# Patient Record
Sex: Female | Born: 1975 | Race: White | Hispanic: No | Marital: Married | Smoking: Never smoker
Health system: Southern US, Community
[De-identification: ages and names within clinical notes are randomized; demographics above are authoritative.]

## PROBLEM LIST (undated history)

## (undated) HISTORY — PX: DENTAL SURGERY: SHX609

---

## 2005-09-03 ENCOUNTER — Inpatient Hospital Stay: Payer: Self-pay | Admitting: Obstetrics and Gynecology

## 2006-08-21 ENCOUNTER — Observation Stay: Payer: Self-pay | Admitting: Obstetrics and Gynecology

## 2006-12-11 ENCOUNTER — Observation Stay: Payer: Self-pay | Admitting: Obstetrics and Gynecology

## 2006-12-23 ENCOUNTER — Inpatient Hospital Stay: Payer: Self-pay

## 2013-01-20 ENCOUNTER — Inpatient Hospital Stay: Payer: Self-pay

## 2013-01-20 LAB — CBC WITH DIFFERENTIAL/PLATELET
Basophil #: 0.1 10*3/uL (ref 0.0–0.1)
Basophil %: 0.5 %
Eosinophil #: 0 10*3/uL (ref 0.0–0.7)
HCT: 29.3 % — ABNORMAL LOW (ref 35.0–47.0)
Lymphocyte #: 2.1 10*3/uL (ref 1.0–3.6)
Lymphocyte %: 16.9 %
MCH: 30.5 pg (ref 26.0–34.0)
MCV: 91 fL (ref 80–100)
Monocyte #: 1.4 x10 3/mm — ABNORMAL HIGH (ref 0.2–0.9)
Neutrophil #: 8.7 10*3/uL — ABNORMAL HIGH (ref 1.4–6.5)
Neutrophil %: 71 %
Platelet: 197 10*3/uL (ref 150–440)
RDW: 15.1 % — ABNORMAL HIGH (ref 11.5–14.5)
WBC: 12.3 10*3/uL — ABNORMAL HIGH (ref 3.6–11.0)

## 2013-01-20 LAB — RAPID HIV-1/2 QL/CONFIRM: HIV-1/2,Rapid Ql: NEGATIVE

## 2014-11-14 NOTE — H&P (Signed)
L&D Evaluation:  History:  HPI 19n y/o [id number] WF [redacted]w[redacted]d   Presents with contractions, pressure   Patient's Medical History No Chronic Illness  H/O preterm and rapid labor   Patient's Surgical History none   Medications Pre Natal Vitamins   Allergies NKDA   Social History none   Family History Non-Contributory   ROS:  ROS All systems were reviewed.  HEENT, CNS, GI, GU, Respiratory, CV, Renal and Musculoskeletal systems were found to be normal.   Exam:  Vital Signs stable   General no apparent distress   Mental Status clear   Abdomen gravid, non-tender   Estimated Fetal Weight Average for gestational age   Back no CVAT   Reflexes 1+   Pelvic no external lesions, 7/70/ballot   Mebranes Intact   FHT normal rate with no decels   Ucx regular   Ucx Frequency 3 min   Skin dry   Other + GBS Well dated   Impression:  Impression active labor, preterm   Plan:  Plan EFM/NST, antibiotics for GBBS prophylaxis   Comments AROM after abx infused for >/= 2 hrs H/O scoliosis -- was old she couldn't have epidural Stadol prn NICU aware   Electronic Signatures: Edison Nasuti (MD)  (Signed 17-Jul-14 14:27)  Authored: L&D Evaluation   Last Updated: 17-Jul-14 14:27 by Edison Nasuti (MD)

## 2015-02-20 ENCOUNTER — Other Ambulatory Visit: Payer: Self-pay | Admitting: Internal Medicine

## 2015-02-20 ENCOUNTER — Ambulatory Visit
Admission: RE | Admit: 2015-02-20 | Discharge: 2015-02-20 | Disposition: A | Discharge status: Home / Self Care | Payer: 59 | Source: Ambulatory Visit | Attending: Internal Medicine | Admitting: Internal Medicine

## 2015-02-20 DIAGNOSIS — K760 Fatty (change of) liver, not elsewhere classified: Secondary | ICD-10-CM | POA: Insufficient documentation

## 2015-02-20 DIAGNOSIS — R1084 Generalized abdominal pain: Secondary | ICD-10-CM | POA: Diagnosis not present

## 2015-02-20 MED ORDER — IOHEXOL 350 MG/ML SOLN
100.0000 mL | Freq: Once | INTRAVENOUS | Status: AC | PRN
  Administered 2015-02-20: 100 mL via INTRAVENOUS

## 2015-02-21 ENCOUNTER — Encounter (HOSPITAL_COMMUNITY): Payer: Self-pay | Admitting: Emergency Medicine

## 2015-02-21 ENCOUNTER — Emergency Department (HOSPITAL_COMMUNITY)
Admission: EM | Admit: 2015-02-21 | Discharge: 2015-02-21 | Disposition: A | Discharge status: Home / Self Care | Payer: 59 | Attending: Emergency Medicine | Admitting: Emergency Medicine

## 2015-02-21 DIAGNOSIS — R109 Unspecified abdominal pain: Secondary | ICD-10-CM | POA: Diagnosis present

## 2015-02-21 DIAGNOSIS — Z79899 Other long term (current) drug therapy: Secondary | ICD-10-CM | POA: Insufficient documentation

## 2015-02-21 DIAGNOSIS — Z3202 Encounter for pregnancy test, result negative: Secondary | ICD-10-CM | POA: Insufficient documentation

## 2015-02-21 DIAGNOSIS — K529 Noninfective gastroenteritis and colitis, unspecified: Secondary | ICD-10-CM | POA: Insufficient documentation

## 2015-02-21 LAB — URINE MICROSCOPIC-ADD ON

## 2015-02-21 LAB — CBC
HEMATOCRIT: 39.5 % (ref 36.0–46.0)
HEMOGLOBIN: 13.3 g/dL (ref 12.0–15.0)
MCH: 29.4 pg (ref 26.0–34.0)
MCHC: 33.7 g/dL (ref 30.0–36.0)
MCV: 87.2 fL (ref 78.0–100.0)
Platelets: 236 10*3/uL (ref 150–400)
RBC: 4.53 MIL/uL (ref 3.87–5.11)
RDW: 13.4 % (ref 11.5–15.5)
WBC: 8 10*3/uL (ref 4.0–10.5)

## 2015-02-21 LAB — COMPREHENSIVE METABOLIC PANEL
ALBUMIN: 3.8 g/dL (ref 3.5–5.0)
ALT: 17 U/L (ref 14–54)
ANION GAP: 12 (ref 5–15)
AST: 34 U/L (ref 15–41)
Alkaline Phosphatase: 51 U/L (ref 38–126)
BILIRUBIN TOTAL: 1 mg/dL (ref 0.3–1.2)
BUN: 6 mg/dL (ref 6–20)
CO2: 25 mmol/L (ref 22–32)
Calcium: 8.9 mg/dL (ref 8.9–10.3)
Chloride: 97 mmol/L — ABNORMAL LOW (ref 101–111)
Creatinine, Ser: 0.75 mg/dL (ref 0.44–1.00)
GLUCOSE: 107 mg/dL — AB (ref 65–99)
POTASSIUM: 2.9 mmol/L — AB (ref 3.5–5.1)
Sodium: 134 mmol/L — ABNORMAL LOW (ref 135–145)
TOTAL PROTEIN: 7.4 g/dL (ref 6.5–8.1)

## 2015-02-21 LAB — URINALYSIS, ROUTINE W REFLEX MICROSCOPIC
Glucose, UA: NEGATIVE mg/dL
Ketones, ur: >80 mg/dL — AB
LEUKOCYTES UA: NEGATIVE
NITRITE: NEGATIVE
PROTEIN: NEGATIVE mg/dL
SPECIFIC GRAVITY, URINE: 1.029 (ref 1.005–1.030)
UROBILINOGEN UA: 0.2 mg/dL (ref 0.0–1.0)
pH: 6 (ref 5.0–8.0)

## 2015-02-21 LAB — POC URINE PREG, ED: PREG TEST UR: NEGATIVE

## 2015-02-21 LAB — LIPASE, BLOOD: Lipase: 17 U/L — ABNORMAL LOW (ref 22–51)

## 2015-02-21 MED ORDER — ONDANSETRON 8 MG PO TBDP: 8.0000 mg | ORAL_TABLET | Freq: Three times a day (TID) | ORAL | Status: DC | PRN

## 2015-02-21 MED ORDER — LOPERAMIDE HCL 2 MG PO CAPS: 2.0000 mg | ORAL_CAPSULE | Freq: Four times a day (QID) | ORAL | Status: DC | PRN

## 2015-02-21 MED ORDER — TRAMADOL HCL 50 MG PO TABS: 50.0000 mg | ORAL_TABLET | Freq: Four times a day (QID) | ORAL | Status: DC | PRN

## 2015-02-21 MED ORDER — IBUPROFEN 600 MG PO TABS: 600.0000 mg | ORAL_TABLET | Freq: Four times a day (QID) | ORAL | Status: DC | PRN

## 2015-02-21 NOTE — ED Provider Notes (Signed)
CSN: [id number]     Arrival date & time 02/21/15  0043 History   First MD Initiated Contact with Patient 02/21/15 [phone]     Chief Complaint  Patient presents with  . Abdominal Pain     (Consider location/radiation/quality/duration/timing/severity/associated sxs/prior Treatment) HPI Comments: Pt comes in with cc of abd pain. She is a healthy female. Started having some abd discomfort 1 day ago, saw her pcp, and CT was ordered. Her pain started getting worse, so she came to the ER. PT has pain in the lower quadrants, and it is worst in the RLQ. No uti like sx. + n/v/diarrhea. No fevers.   ROS 10 Systems reviewed and are negative for acute change except as noted in the HPI.     The history is provided by the patient.    History reviewed. No pertinent past medical history. History reviewed. No pertinent past surgical history. No family history on file. Social History  Substance Use Topics  . Smoking status: Never Smoker   . Smokeless tobacco: None  . Alcohol Use: No   OB History    No data available     Review of Systems    Allergies  Review of patient's allergies indicates no known allergies.  Home Medications   Prior to Admission medications   Medication Sig Start Date End Date Taking? Authorizing Provider  Cholecalciferol (VITAMIN D3) 5000 UNITS TABS Take 5,000 Units by mouth daily.   Yes Historical Provider, MD  levofloxacin (LEVAQUIN) 500 MG tablet Take 500 mg by mouth daily. 02/20/15  Yes Historical Provider, MD  ibuprofen (ADVIL,MOTRIN) 600 MG tablet Take 1 tablet (600 mg total) by mouth every 6 (six) hours as needed. 02/21/15   Varney Biles, MD  loperamide (IMODIUM) 2 MG capsule Take 1 capsule (2 mg total) by mouth 4 (four) times daily as needed for diarrhea or loose stools. 02/21/15   Varney Biles, MD  ondansetron (ZOFRAN ODT) 8 MG disintegrating tablet Take 1 tablet (8 mg total) by mouth every 8 (eight) hours as needed for nausea. 02/21/15   Varney Biles, MD   traMADol (ULTRAM) 50 MG tablet Take 1 tablet (50 mg total) by mouth every 6 (six) hours as needed. 02/21/15   Devonna Oboyle, MD   BP 122/70 mmHg  Pulse 80  Temp(Src) 99.4 F (37.4 C) (Rectal)  Resp 16  SpO2 96%  LMP 01/28/2015 Physical Exam  Constitutional: She is oriented to person, place, and time. She appears well-developed.  HENT:  Head: Normocephalic and atraumatic.  Eyes: Conjunctivae and EOM are normal. Pupils are equal, round, and reactive to light.  Neck: Normal range of motion. Neck supple.  Cardiovascular: Normal rate, regular rhythm and normal heart sounds.   Pulmonary/Chest: Effort normal and breath sounds normal. No respiratory distress.  Abdominal: Soft. Bowel sounds are normal. She exhibits no distension. There is tenderness. There is no rebound and no guarding.  Bilateral lower quadrant tenderness, right is worst.  Neurological: She is alert and oriented to person, place, and time.  Skin: Skin is warm and dry.  Nursing note and vitals reviewed.   ED Course  Procedures (including critical care time) Labs Review Labs Reviewed  LIPASE, BLOOD - Abnormal; Notable for the following:    Lipase 17 (*)    All other components within normal limits  COMPREHENSIVE METABOLIC PANEL - Abnormal; Notable for the following:    Sodium 134 (*)    Potassium 2.9 (*)    Chloride 97 (*)    Glucose, Bld  107 (*)    All other components within normal limits  URINALYSIS, ROUTINE W REFLEX MICROSCOPIC (NOT AT [facility]) - Abnormal; Notable for the following:    Hgb urine dipstick LARGE (*)    Bilirubin Urine SMALL (*)    Ketones, ur >80 (*)    All other components within normal limits  URINE MICROSCOPIC-ADD ON - Abnormal; Notable for the following:    Squamous Epithelial / LPF FEW (*)    All other components within normal limits  CBC  POC URINE PREG, ED  POC URINE PREG, ED    Imaging Review Ct Abdomen Pelvis W Contrast  02/20/2015   CLINICAL DATA:  Right lower and left lower  quadrant pain for 2 days with diarrhea. Generalized abdominal pain.  EXAM: CT ABDOMEN AND PELVIS WITH CONTRAST  TECHNIQUE: Multidetector CT imaging of the abdomen and pelvis was performed using the standard protocol following bolus administration of intravenous contrast.  CONTRAST:  129mL OMNIPAQUE IOHEXOL 350 MG/ML SOLN  COMPARISON:  None.  FINDINGS: Lower chest: Clear lung bases. Normal heart size without pericardial or pleural effusion.  Hepatobiliary: Mild hepatic steatosis. Normal gallbladder, without biliary ductal dilatation.  Pancreas: Normal, without mass or ductal dilatation.  Spleen: Multiple splenic lesions, on the order of 9 mm or less. These are hypoattenuating and scattered throughout the spleen. The spleen is normal in size, without adjacent adenopathy.  Adrenals/Urinary Tract: Normal adrenal glands. Interpolar right renal lesion of 6 mm is likely a cyst. Normal left kidney, without hydronephrosis. Collapsed urinary bladder.  Stomach/Bowel: Normal stomach, without wall thickening. Normal colon, appendix, and terminal ileum. Suspect mild pelvic small bowel wall thickening with adjacent fluid. Example image 73.  Vascular/Lymphatic: Normal caliber of the aorta and branch vessels. Circumaortic left renal vein. No abdominopelvic adenopathy.  Reproductive: Normal uterus and adnexa.  Other: Small volume cul-de-sac fluid is likely physiologic.  Musculoskeletal: Convex left lumbar spine curvature.  IMPRESSION: 1. Mild pelvic small bowel wall thickening suspected, for which infectious enteritis is a concern. No other explanation for patient's symptoms. 2. Multiple low-density sub cm splenic lesions. Nonspecific. If the patient has left upper quadrant symptoms, or fever, atypical or fungal infection would be a consideration. Presuming no such symptoms, these most likely represent benign entities such as hemangiomas. If further imaging characterization is desired, pre and post contrast abdominal MRI may be  informative (to be performed as an outpatient, when patient can hold of breath.) 3. Mild hepatic steatosis. These results will be called to the ordering clinician or representative by the Radiology Department at the imaging location.   Electronically Signed   By: Abigail Miyamoto M.D.   On: 02/20/2015 19:09   I have personally reviewed and evaluated lab results as part of my medical decision-making.   EKG Interpretation None      MDM   Final diagnoses:  Enteritis   Pt comes in with cc of abd pain, diarrhea. She had a CT done earlier in the day, which i have reviewed. Pt has no fevers, no bloody stools  - will tx as enteritis.    Varney Biles, MD 02/22/15 1041

## 2015-02-21 NOTE — ED Notes (Addendum)
Pt. Left with all belongings and refused wheelchair. Discharge instructions were reviewed and all questions were answered.  

## 2015-02-21 NOTE — Discharge Instructions (Signed)
We saw you in the ER for the abdominal pain, diarrhea. All the results in the ER are normal.  CT scan shows: IMPRESSION: 1. Mild pelvic small bowel wall thickening suspected, for which infectious enteritis is a concern. No other explanation for patient's symptoms. 2. Multiple low-density sub cm splenic lesions. Nonspecific. If the patient has left upper quadrant symptoms, or fever, atypical or fungal infection would be a consideration. Presuming no such symptoms, these most likely represent benign entities such as hemangiomas. If further imaging characterization is desired, pre and post contrast abdominal MRI may be informative (to be performed as an outpatient, when patient can hold of breath.) 3. Mild hepatic steatosis. These results will be called to the ordering clinician or representative by the Radiology Department at the imaging location.   AS DISCUSSED: Most likely cause of enteritis is viral infection or food toxin. Without fevers, bloody stools - antibiotics not indicated. See your pcp if you have bloody stools or the diarrhea lasts longer than 1 week.  Take diarrhea meds only if having severe diarrhea (getting dehydrated) or for diarrhea at night time.  Viral Gastroenteritis Viral gastroenteritis is also known as stomach flu. This condition affects the stomach and intestinal tract. It can cause sudden diarrhea and vomiting. The illness typically lasts 3 to 8 days. Most people develop an immune response that eventually gets rid of the virus. While this natural response develops, the virus can make you quite ill. CAUSES  Many different viruses can cause gastroenteritis, such as rotavirus or noroviruses. You can catch one of these viruses by consuming contaminated food or water. You may also catch a virus by sharing utensils or other personal items with an infected person or by touching a contaminated surface. SYMPTOMS  The most common symptoms are diarrhea and vomiting. These  problems can cause a severe loss of body fluids (dehydration) and a body salt (electrolyte) imbalance. Other symptoms may include:  Fever.  Headache.  Fatigue.  Abdominal pain. DIAGNOSIS  Your caregiver can usually diagnose viral gastroenteritis based on your symptoms and a physical exam. A stool sample may also be taken to test for the presence of viruses or other infections. TREATMENT  This illness typically goes away on its own. Treatments are aimed at rehydration. The most serious cases of viral gastroenteritis involve vomiting so severely that you are not able to keep fluids down. In these cases, fluids must be given through an intravenous line (IV). HOME CARE INSTRUCTIONS   Drink enough fluids to keep your urine clear or pale yellow. Drink small amounts of fluids frequently and increase the amounts as tolerated.  Ask your caregiver for specific rehydration instructions.  Avoid:  Foods high in sugar.  Alcohol.  Carbonated drinks.  Tobacco.  Juice.  Caffeine drinks.  Extremely hot or cold fluids.  Fatty, greasy foods.  Too much intake of anything at one time.  Dairy products until 24 to 48 hours after diarrhea stops.  You may consume probiotics. Probiotics are active cultures of beneficial bacteria. They may lessen the amount and number of diarrheal stools in adults. Probiotics can be found in yogurt with active cultures and in supplements.  Wash your hands well to avoid spreading the virus.  Only take over-the-counter or prescription medicines for pain, discomfort, or fever as directed by your caregiver. Do not give aspirin to children. Antidiarrheal medicines are not recommended.  Ask your caregiver if you should continue to take your regular prescribed and over-the-counter medicines.  Keep all follow-up appointments  as directed by your caregiver. SEEK IMMEDIATE MEDICAL CARE IF:   You are unable to keep fluids down.  You do not urinate at least once every 6  to 8 hours.  You develop shortness of breath.  You notice blood in your stool or vomit. This may look like coffee grounds.  You have abdominal pain that increases or is concentrated in one small area (localized).  You have persistent vomiting or diarrhea.  You have a fever.  The patient is a child younger than 3 months, and he or she has a fever.  The patient is a child older than 3 months, and he or she has a fever and persistent symptoms.  The patient is a child older than 3 months, and he or she has a fever and symptoms suddenly get worse.  The patient is a baby, and he or she has no tears when crying. MAKE SURE YOU:   Understand these instructions.  Will watch your condition.  Will get help right away if you are not doing well or get worse. Document Released: 06/23/2005 Document Revised: 09/15/2011 Document Reviewed: 04/09/2011 [facility] Patient Information 2015 [place], [place]. This information is not intended to replace advice given to you by your health care provider. Make sure you discuss any questions you have with your health care provider.

## 2015-02-21 NOTE — ED Notes (Addendum)
Accidentally clicked off pt urine sample.  RN aware and redoing order.

## 2015-02-21 NOTE — ED Notes (Signed)
Pt. reports pain across her abdomen with emesis and diarrhea onset Sunday , seen by her PCP and CT scan done at [facility] hospital yesterday . Denies fever or chills.

## 2015-06-12 ENCOUNTER — Other Ambulatory Visit: Payer: Self-pay | Admitting: Obstetrics and Gynecology

## 2015-06-12 DIAGNOSIS — Z1231 Encounter for screening mammogram for malignant neoplasm of breast: Secondary | ICD-10-CM

## 2015-06-27 ENCOUNTER — Ambulatory Visit
Admission: RE | Admit: 2015-06-27 | Discharge: 2015-06-27 | Disposition: A | Discharge status: Home / Self Care | Payer: 59 | Source: Ambulatory Visit | Attending: Obstetrics and Gynecology | Admitting: Obstetrics and Gynecology

## 2015-06-27 DIAGNOSIS — Z1231 Encounter for screening mammogram for malignant neoplasm of breast: Secondary | ICD-10-CM | POA: Diagnosis not present

## 2016-05-27 ENCOUNTER — Other Ambulatory Visit: Payer: Self-pay | Admitting: Obstetrics and Gynecology

## 2016-05-27 DIAGNOSIS — Z1231 Encounter for screening mammogram for malignant neoplasm of breast: Secondary | ICD-10-CM

## 2016-07-08 ENCOUNTER — Encounter (HOSPITAL_COMMUNITY): Payer: Self-pay

## 2016-07-08 ENCOUNTER — Ambulatory Visit
Admission: RE | Admit: 2016-07-08 | Discharge: 2016-07-08 | Disposition: A | Discharge status: Home / Self Care | Payer: 59 | Source: Ambulatory Visit | Attending: Obstetrics and Gynecology | Admitting: Obstetrics and Gynecology

## 2016-07-08 DIAGNOSIS — Z1231 Encounter for screening mammogram for malignant neoplasm of breast: Secondary | ICD-10-CM | POA: Diagnosis not present

## 2017-08-30 ENCOUNTER — Other Ambulatory Visit: Payer: Self-pay

## 2017-08-30 ENCOUNTER — Encounter: Payer: Self-pay | Admitting: Emergency Medicine

## 2017-08-30 ENCOUNTER — Ambulatory Visit
Admission: EM | Admit: 2017-08-30 | Discharge: 2017-08-30 | Disposition: A | Discharge status: Home / Self Care | Payer: Managed Care, Other (non HMO) | Attending: Family Medicine | Admitting: Family Medicine

## 2017-08-30 DIAGNOSIS — J020 Streptococcal pharyngitis: Secondary | ICD-10-CM

## 2017-08-30 LAB — RAPID STREP SCREEN (MED CTR MEBANE ONLY): Streptococcus, Group A Screen (Direct): POSITIVE — AB

## 2017-08-30 MED ORDER — AMOXICILLIN 500 MG PO TABS: 500.0000 mg | ORAL_TABLET | Freq: Two times a day (BID) | ORAL | 0 refills | Status: DC

## 2017-08-30 NOTE — ED Triage Notes (Signed)
Patient c/o sore throat since Thursday.

## 2017-08-30 NOTE — ED Provider Notes (Signed)
[facility]    CSN: [id number] Arrival date & time: 08/30/17  0818  History   Chief Complaint Chief Complaint  Patient presents with  . Sore Throat   HPI  42 year old female presents with sore throat.  Patient states that her sore throat started last week and then resolved.  Recurred on Thursday.  She reports associated headache and neck pain as of this morning.  She has no other associated symptoms.  No known exacerbating relieving factors.  She reports recent sick contacts.  No other complaints or concerns at this time.  PMH: Scoliosis  Surgical Hx - No past surgeries.  Home Medications    Prior to Admission medications   Medication Sig Start Date End Date Taking? Authorizing Provider  Cholecalciferol (VITAMIN D3) 5000 UNITS TABS Take 5,000 Units by mouth daily.   Yes [provider]  amoxicillin (AMOXIL) 500 MG tablet Take 1 tablet (500 mg total) by mouth 2 (two) times daily. 08/30/17   Coral Spikes, DO  ibuprofen (ADVIL,MOTRIN) 600 MG tablet Take 1 tablet (600 mg total) by mouth every 6 (six) hours as needed. 02/21/15   Varney Biles, MD   Family History Family History  Problem Relation Age of Onset  . Breast cancer Neg Hx    Social History Social History   Tobacco Use  . Smoking status: Never Smoker  . Smokeless tobacco: Never Used  Substance Use Topics  . Alcohol use: No  . Drug use: No     Allergies   Patient has no known allergies.   Review of Systems Review of Systems  Constitutional: Negative for fever.  HENT: Positive for sore throat.   Musculoskeletal: Positive for neck pain.  Neurological: Positive for headaches.   Physical Exam Triage Vital Signs ED Triage Vitals  Enc Vitals Group     BP 08/30/17 0832 131/81     Pulse Rate 08/30/17 0832 82     Resp 08/30/17 0832 16     Temp 08/30/17 0832 98.7 F (37.1 C)     Temp Source 08/30/17 0832 Oral     SpO2 08/30/17 0832 100 %     Weight 08/30/17 0830 130 lb (59 kg)   Height 08/30/17 0830 5\' 6"  (1.676 m)     Head Circumference --      Peak Flow --      Pain Score 08/30/17 0829 7     Pain Loc --      Pain Edu? --      Excl. in [place]? --    Updated Vital Signs BP 131/81 (BP Location: Left Arm)   Pulse 82   Temp 98.7 F (37.1 C) (Oral)   Resp 16   Ht 5\' 6"  (1.676 m)   Wt 130 lb (59 kg)   LMP 08/16/2017 (Approximate)   SpO2 100%   BMI 20.98 kg/m   Physical Exam  Constitutional: She is oriented to person, place, and time. She appears well-developed and well-nourished. No distress.  HENT:  Head: Normocephalic and atraumatic.  Oropharynx with moderate erythema.  No exudate.  Eyes: Conjunctivae are normal. Right eye exhibits no discharge. Left eye exhibits no discharge.  Cardiovascular: Normal rate and regular rhythm.  Pulmonary/Chest: Effort normal and breath sounds normal.  Neurological: She is alert and oriented to person, place, and time.  Psychiatric: She has a normal mood and affect. Her behavior is normal.  Nursing note and vitals reviewed.  UC Treatments / Results  Labs (all labs ordered are listed, but  only abnormal results are displayed) Labs Reviewed  RAPID STREP SCREEN (NOT AT [facility]) - Abnormal; Notable for the following components:      Result Value   Streptococcus, Group A Screen (Direct) POSITIVE (*)    All other components within normal limits    EKG  EKG Interpretation None       Radiology No results found.  Procedures Procedures (including critical care time)  Medications Ordered in UC Medications - No data to display   Initial Impression / Assessment and Plan / UC Course  I have reviewed the triage vital signs and the nursing notes.  Pertinent labs & imaging results that were available during my care of the patient were reviewed by me and considered in my medical decision making (see chart for details).     42 year old female presents with strep pharyngitis.  Treating with amoxicillin.  Final Clinical  Impressions(s) / UC Diagnoses   Final diagnoses:  Strep pharyngitis    ED Discharge Orders        Ordered    amoxicillin (AMOXIL) 500 MG tablet  2 times daily     08/30/17 0924     Controlled Substance Prescriptions [place] Controlled Substance Registry consulted? Not Applicable   Coral Spikes, [place] 08/30/17 [phone]

## 2017-09-02 ENCOUNTER — Telehealth: Payer: Self-pay | Admitting: Emergency Medicine

## 2017-09-02 NOTE — Telephone Encounter (Signed)
Called to follow up after patient's recent visit. LM to call with any questions or concerns.

## 2017-09-21 ENCOUNTER — Other Ambulatory Visit: Payer: Self-pay | Admitting: Internal Medicine

## 2017-09-21 DIAGNOSIS — Z1231 Encounter for screening mammogram for malignant neoplasm of breast: Secondary | ICD-10-CM

## 2017-10-02 ENCOUNTER — Ambulatory Visit
Admission: RE | Admit: 2017-10-02 | Discharge: 2017-10-02 | Disposition: A | Discharge status: Home / Self Care | Payer: Managed Care, Other (non HMO) | Source: Ambulatory Visit | Attending: Internal Medicine | Admitting: Internal Medicine

## 2017-10-02 DIAGNOSIS — Z1231 Encounter for screening mammogram for malignant neoplasm of breast: Secondary | ICD-10-CM | POA: Diagnosis not present

## 2017-11-29 IMAGING — MG MM DIGITAL SCREENING BILAT W/ CAD
4 series · 4 of 4 positions shown · non-contrast
Comparison: Previous exam(s).

CLINICAL DATA: Screening.

EXAM:
DIGITAL SCREENING BILATERAL MAMMOGRAM WITH CAD

[R CC]
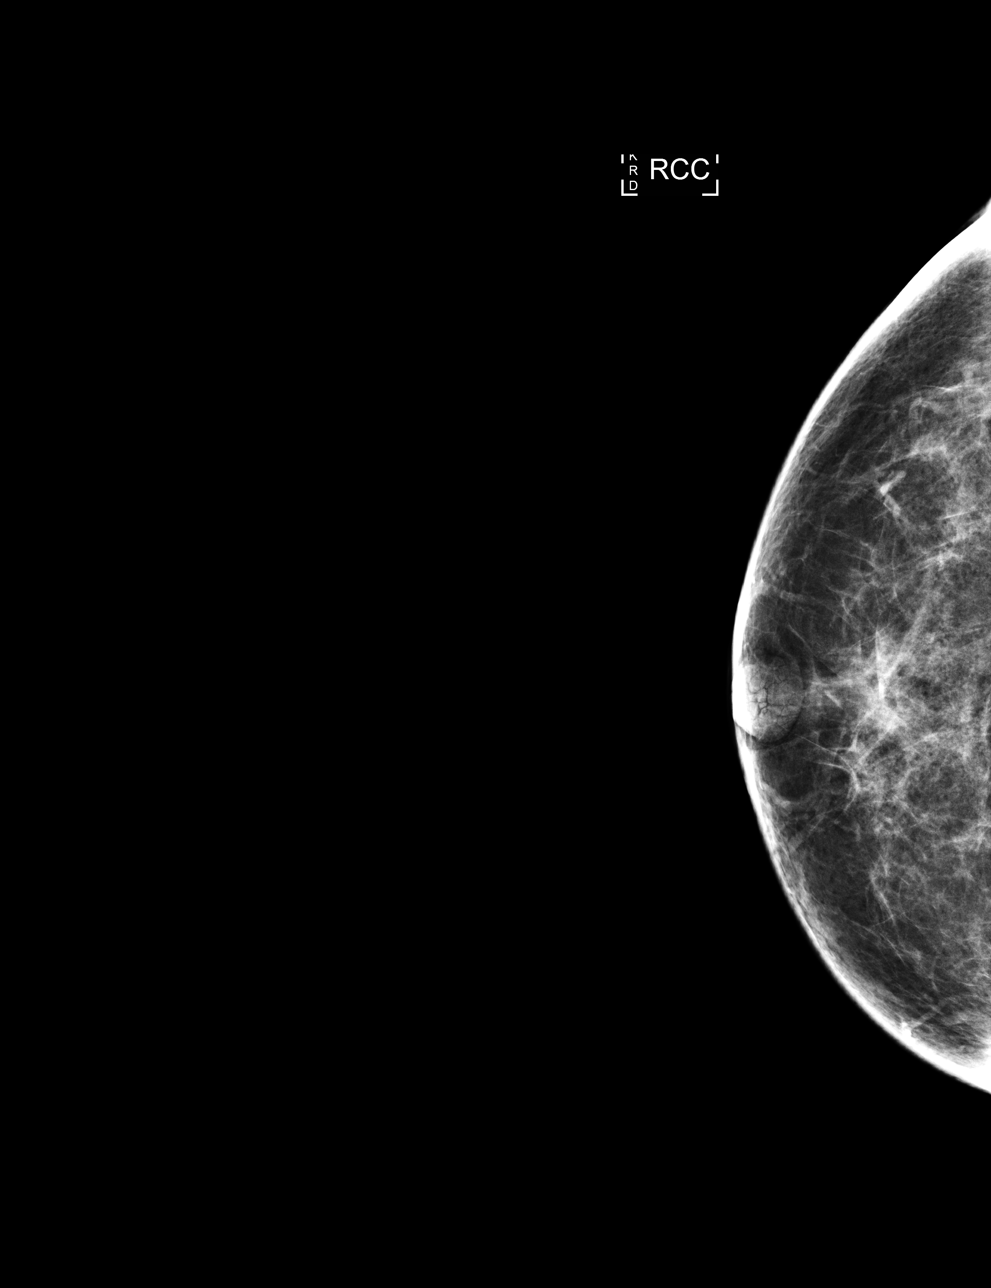

[L MLO]
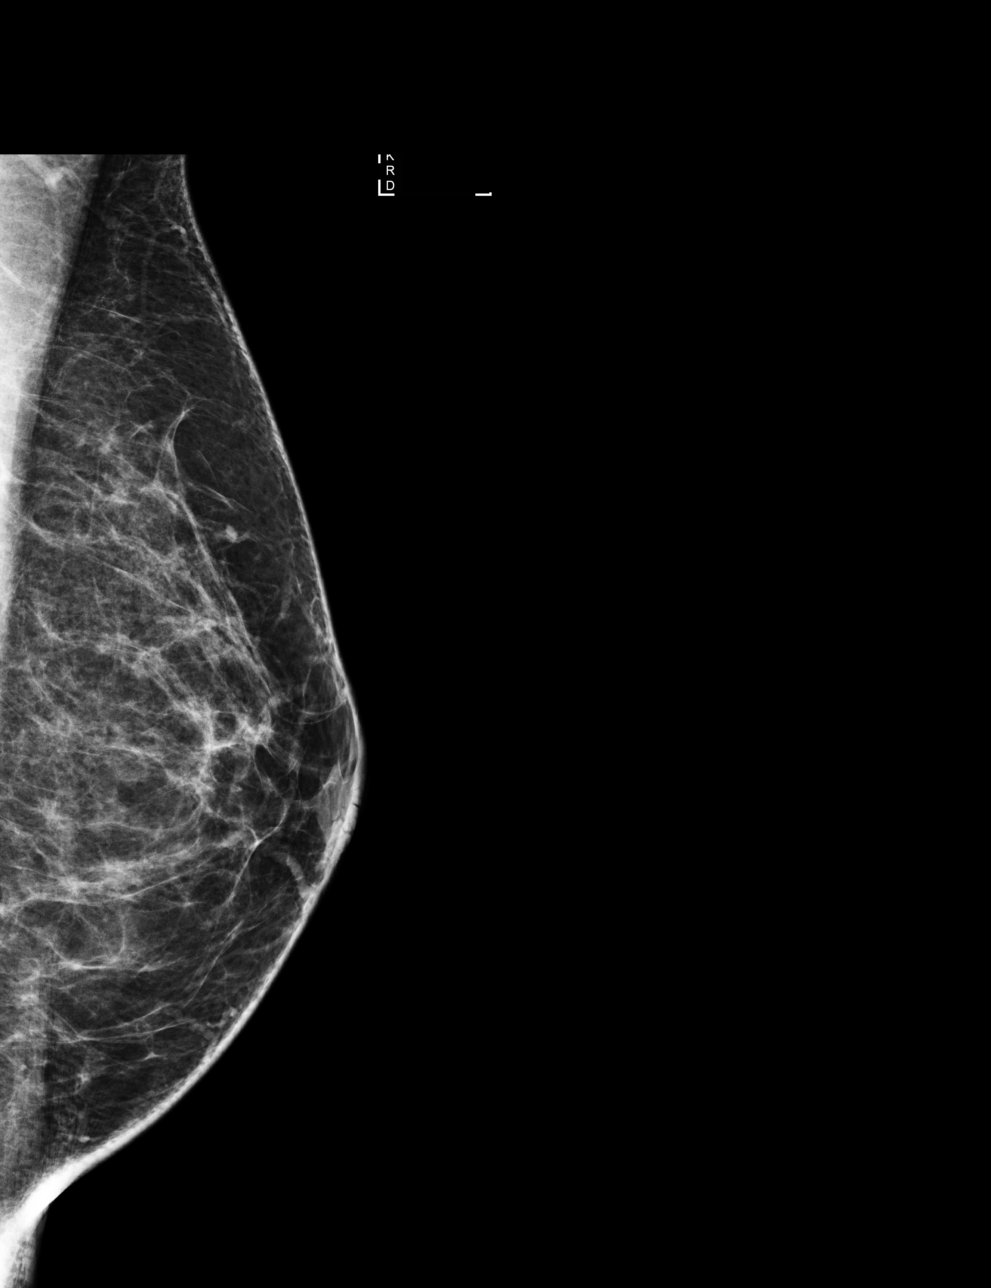

[L CC]
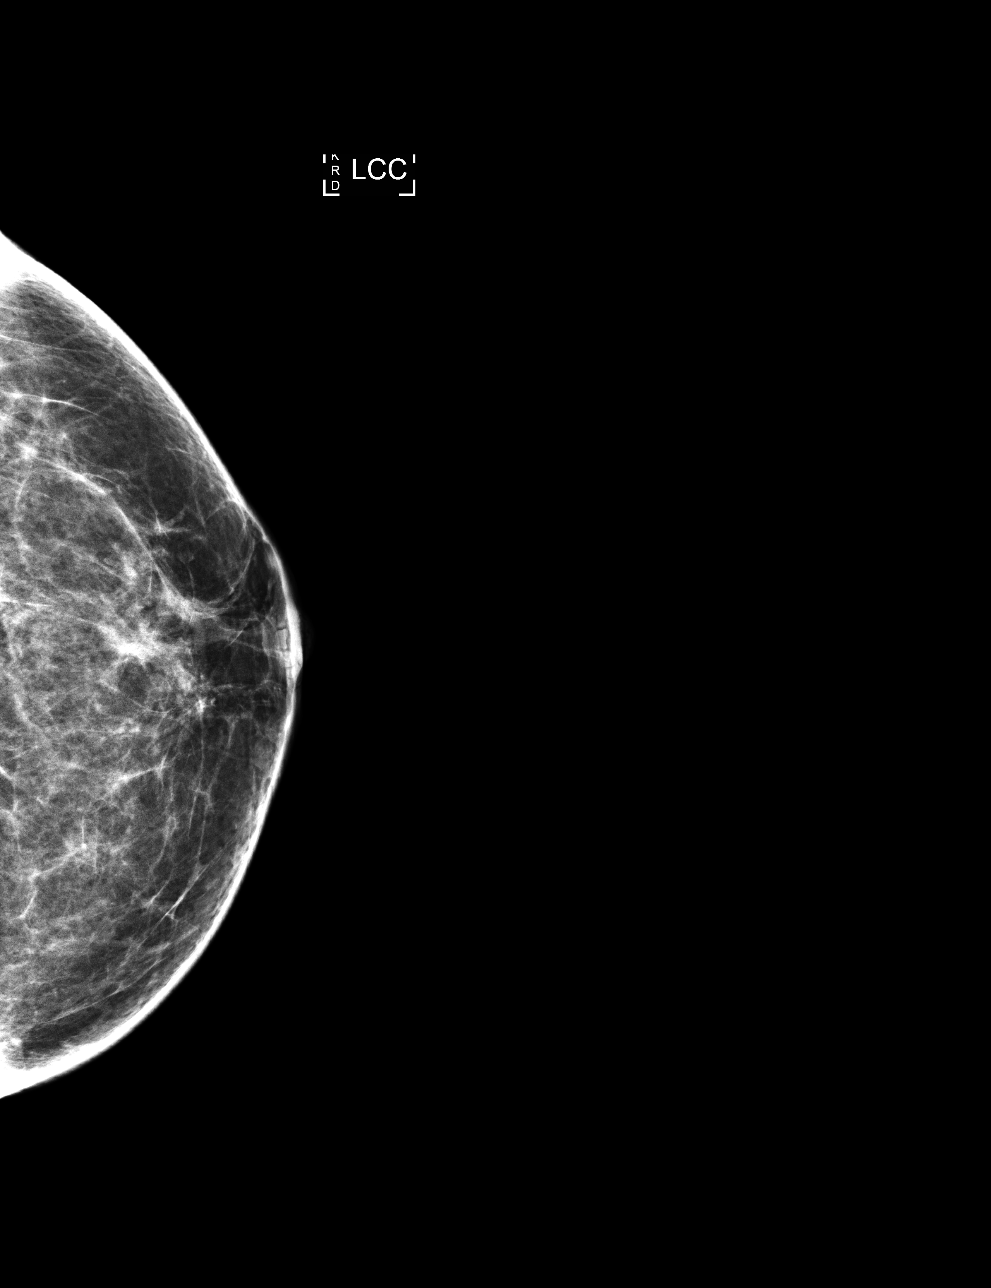

[R MLO]
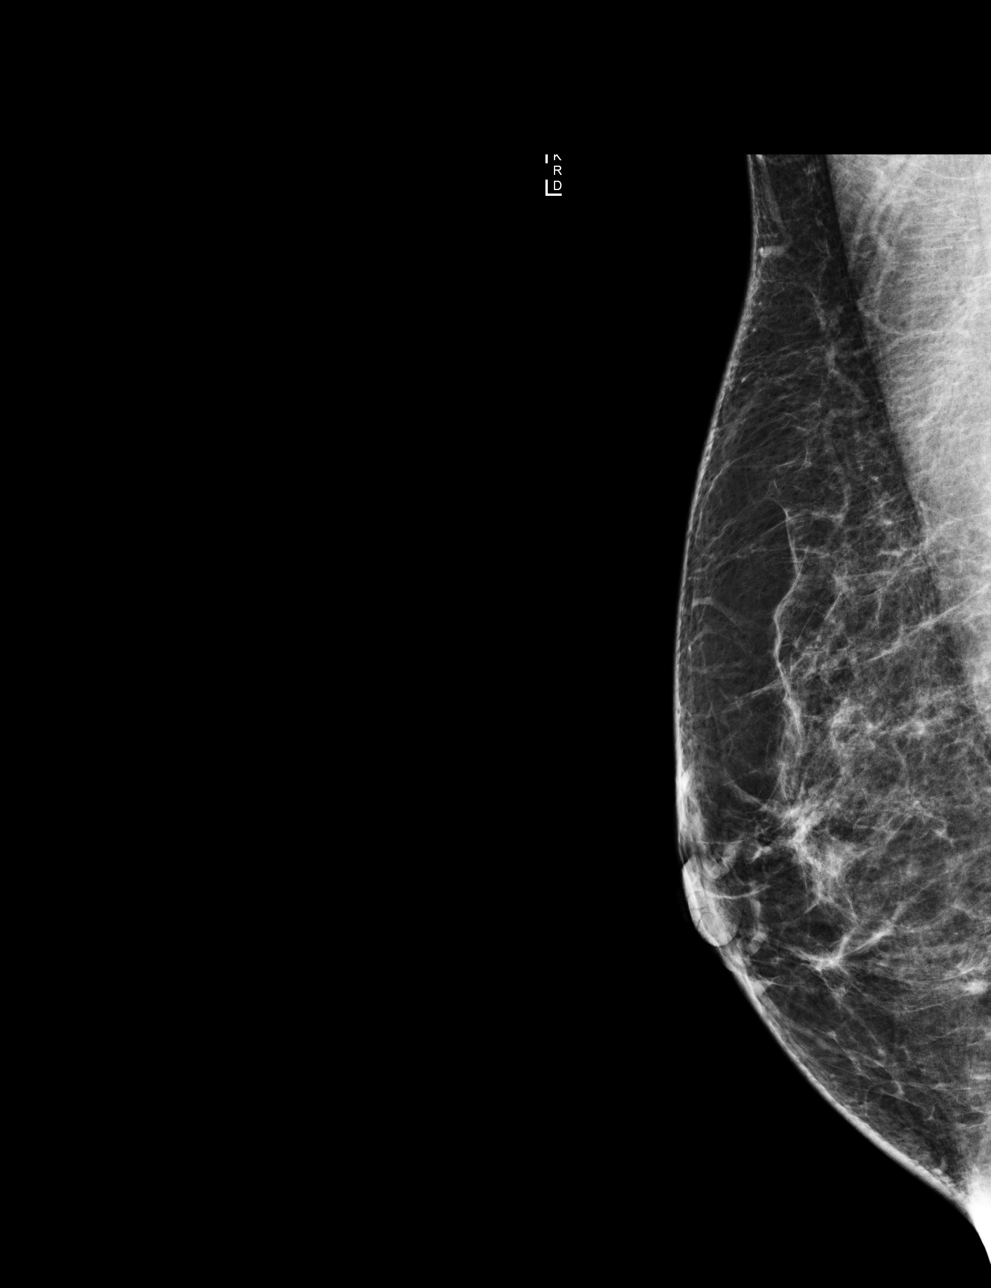

[4 of 4 positions shown; findings below may reference images not displayed]

ACR Breast Density Category b: There are scattered areas of
fibroglandular density.
FINDINGS: There are no findings suspicious for malignancy. Images were
processed with CAD.
IMPRESSION: No mammographic evidence of malignancy. A result letter of this
screening mammogram will be mailed directly to the patient.

RECOMMENDATION:
Screening mammogram in one year. (Code:AS-G-LCT)

BI-RADS CATEGORY  1: Negative.

## 2018-05-26 ENCOUNTER — Other Ambulatory Visit: Payer: Self-pay

## 2018-05-26 ENCOUNTER — Encounter: Payer: Self-pay | Admitting: Emergency Medicine

## 2018-05-26 ENCOUNTER — Ambulatory Visit
Admission: EM | Admit: 2018-05-26 | Discharge: 2018-05-26 | Disposition: A | Discharge status: Home / Self Care | Payer: Managed Care, Other (non HMO) | Attending: Family Medicine | Admitting: Family Medicine

## 2018-05-26 DIAGNOSIS — J029 Acute pharyngitis, unspecified: Secondary | ICD-10-CM | POA: Diagnosis not present

## 2018-05-26 LAB — RAPID STREP SCREEN (MED CTR MEBANE ONLY): Streptococcus, Group A Screen (Direct): NEGATIVE

## 2018-05-26 MED ORDER — LIDOCAINE VISCOUS HCL 2 % MT SOLN: OROMUCOSAL | 0 refills | Status: DC

## 2018-05-26 NOTE — ED Provider Notes (Signed)
[facility]    CSN: [id number] Arrival date & time: 05/26/18  0808     History   Chief Complaint Chief Complaint  Patient presents with  . Sore Throat  . Cough    HPI Alexis Wright is a 42 y.o. female.   The history is provided by the patient.  Sore Throat   Cough  Associated symptoms: sore throat   Associated symptoms: no wheezing   URI  Presenting symptoms: congestion, cough and sore throat   Severity:  Moderate Onset quality:  Sudden Duration:  3 days Timing:  Constant Progression:  Unchanged Chronicity:  New Relieved by:  Nothing Ineffective treatments:  OTC medications Associated symptoms: no sinus pain and no wheezing   Risk factors: sick contacts   Risk factors: not elderly, no chronic cardiac disease, no chronic kidney disease, no chronic respiratory disease, no diabetes mellitus, no immunosuppression, no recent illness and no recent travel     History reviewed. No pertinent past medical history.  There are no active problems to display for this patient.   History reviewed. No pertinent surgical history.  OB History   None      Home Medications    Prior to Admission medications   Medication Sig Start Date End Date Taking? Authorizing Provider  amoxicillin (AMOXIL) 500 MG tablet Take 1 tablet (500 mg total) by mouth 2 (two) times daily. 08/30/17   Coral Spikes, DO  Cholecalciferol (VITAMIN D3) 5000 UNITS TABS Take 5,000 Units by mouth daily.    [provider]  ibuprofen (ADVIL,MOTRIN) 600 MG tablet Take 1 tablet (600 mg total) by mouth every 6 (six) hours as needed. 02/21/15   Varney Biles, MD  lidocaine (XYLOCAINE) 2 % solution 20 ml gargle and spit q 6 hours prn 05/26/18   Norval Gable, MD    Family History Family History  Problem Relation Age of Onset  . Breast cancer Neg Hx     Social History Social History   Tobacco Use  . Smoking status: Never Smoker  . Smokeless tobacco: Never Used  Substance Use  Topics  . Alcohol use: No  . Drug use: No     Allergies   Patient has no known allergies.   Review of Systems Review of Systems  HENT: Positive for congestion and sore throat. Negative for sinus pain.   Respiratory: Positive for cough. Negative for wheezing.      Physical Exam Triage Vital Signs ED Triage Vitals  Enc Vitals Group     BP 05/26/18 0823 127/83     Pulse Rate 05/26/18 0823 65     Resp 05/26/18 0823 18     Temp 05/26/18 0823 98.6 F (37 C)     Temp Source 05/26/18 0823 Oral     SpO2 05/26/18 0823 100 %     Weight 05/26/18 0824 130 lb (59 kg)     Height 05/26/18 0824 5\' 6"  (1.676 m)     Head Circumference --      Peak Flow --      Pain Score 05/26/18 0824 5     Pain Loc --      Pain Edu? --      Excl. in [place]? --    No data found.  Updated Vital Signs BP 127/83 (BP Location: Right Arm)   Pulse 65   Temp 98.6 F (37 C) (Oral)   Resp 18   Ht 5\' 6"  (1.676 m)   Wt 59 kg   LMP  05/17/2018   SpO2 100%   BMI 20.98 kg/m   Visual Acuity Right Eye Distance:   Left Eye Distance:   Bilateral Distance:    Right Eye Near:   Left Eye Near:    Bilateral Near:     Physical Exam  Constitutional: She appears well-developed and well-nourished. No distress.  HENT:  Head: Normocephalic and atraumatic.  Right Ear: Tympanic membrane, external ear and ear canal normal.  Left Ear: Tympanic membrane, external ear and ear canal normal.  Nose: No mucosal edema, rhinorrhea, nose lacerations, sinus tenderness, nasal deformity, septal deviation or nasal septal hematoma. No epistaxis.  No foreign bodies. Right sinus exhibits no maxillary sinus tenderness and no frontal sinus tenderness. Left sinus exhibits no maxillary sinus tenderness and no frontal sinus tenderness.  Mouth/Throat: Uvula is midline and mucous membranes are normal. Posterior oropharyngeal erythema present. No oropharyngeal exudate, posterior oropharyngeal edema or tonsillar abscesses. No tonsillar exudate.    Eyes: Conjunctivae are normal. Right eye exhibits no discharge. Left eye exhibits no discharge. No scleral icterus.  Neck: Normal range of motion. Neck supple. No thyromegaly present.  Cardiovascular: Normal rate, regular rhythm and normal heart sounds.  Pulmonary/Chest: Effort normal and breath sounds normal. No stridor. No respiratory distress. She has no wheezes. She has no rales.  Lymphadenopathy:    She has no cervical adenopathy.  Skin: She is not diaphoretic.  Nursing note and vitals reviewed.    UC Treatments / Results  Labs (all labs ordered are listed, but only abnormal results are displayed) Labs Reviewed  RAPID STREP SCREEN ([facility] ONLY)  CULTURE, GROUP A STREP [facility])    EKG None  Radiology No results found.  Procedures Procedures (including critical care time)  Medications Ordered in UC Medications - No data to display  Initial Impression / Assessment and Plan / UC Course  I have reviewed the triage vital signs and the nursing notes.  Pertinent labs & imaging results that were available during my care of the patient were reviewed by me and considered in my medical decision making (see chart for details).      Final Clinical Impressions(s) / UC Diagnoses   Final diagnoses:  Viral pharyngitis    ED Prescriptions    Medication Sig Dispense Auth. Provider   lidocaine (XYLOCAINE) 2 % solution 20 ml gargle and spit q 6 hours prn 100 mL Norval Gable, MD      1. Lab results and diagnosis reviewed with patient 2. rx as per orders above; reviewed possible side effects, interactions, risks and benefits  3. Recommend supportive treatment with rest, fluids, otc meds  4. Follow-up prn if symptoms worsen or don't improve   Controlled Su bstance Prescriptions [place] Controlled Substance Registry consulted? Not Applicable   Norval Gable, MD 05/26/18 [phone]

## 2018-05-26 NOTE — ED Triage Notes (Signed)
Patient c/o cough, sore throat and fever that started on Monday. Patient states she has been taking Dayquil OTC with no relief.

## 2018-05-29 LAB — CULTURE, GROUP A STREP (THRC)

## 2018-08-19 ENCOUNTER — Other Ambulatory Visit: Payer: Self-pay | Admitting: Internal Medicine

## 2018-08-19 DIAGNOSIS — Z1231 Encounter for screening mammogram for malignant neoplasm of breast: Secondary | ICD-10-CM

## 2019-02-23 IMAGING — MG MM DIGITAL SCREENING BILAT W/ CAD
4 series · 4 of 4 positions shown · non-contrast
Comparison: Previous exam(s).

CLINICAL DATA: Screening.

EXAM:
DIGITAL SCREENING BILATERAL MAMMOGRAM WITH CAD

[L CC]
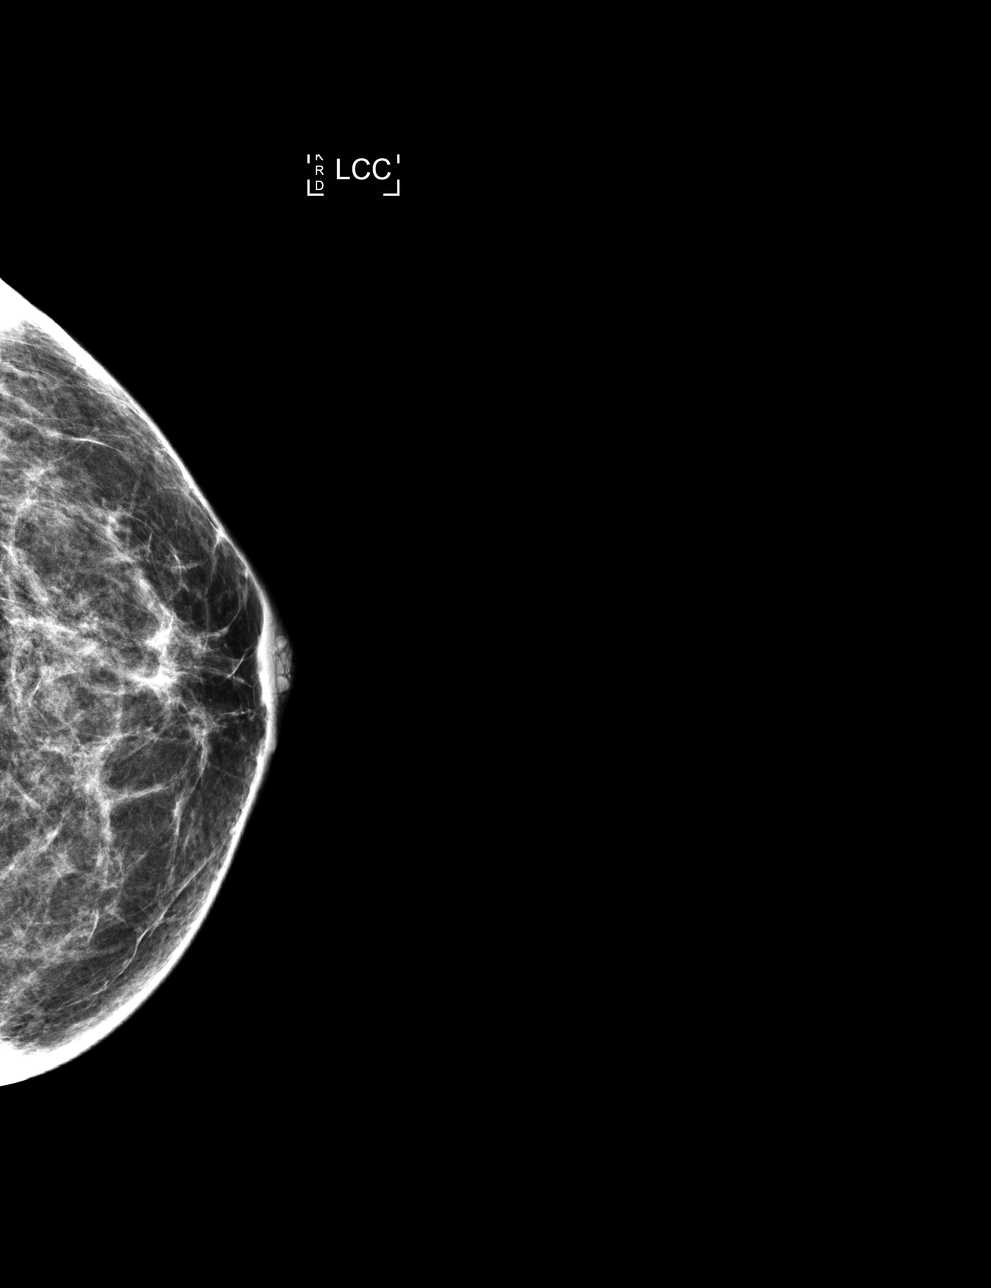

[L MLO]
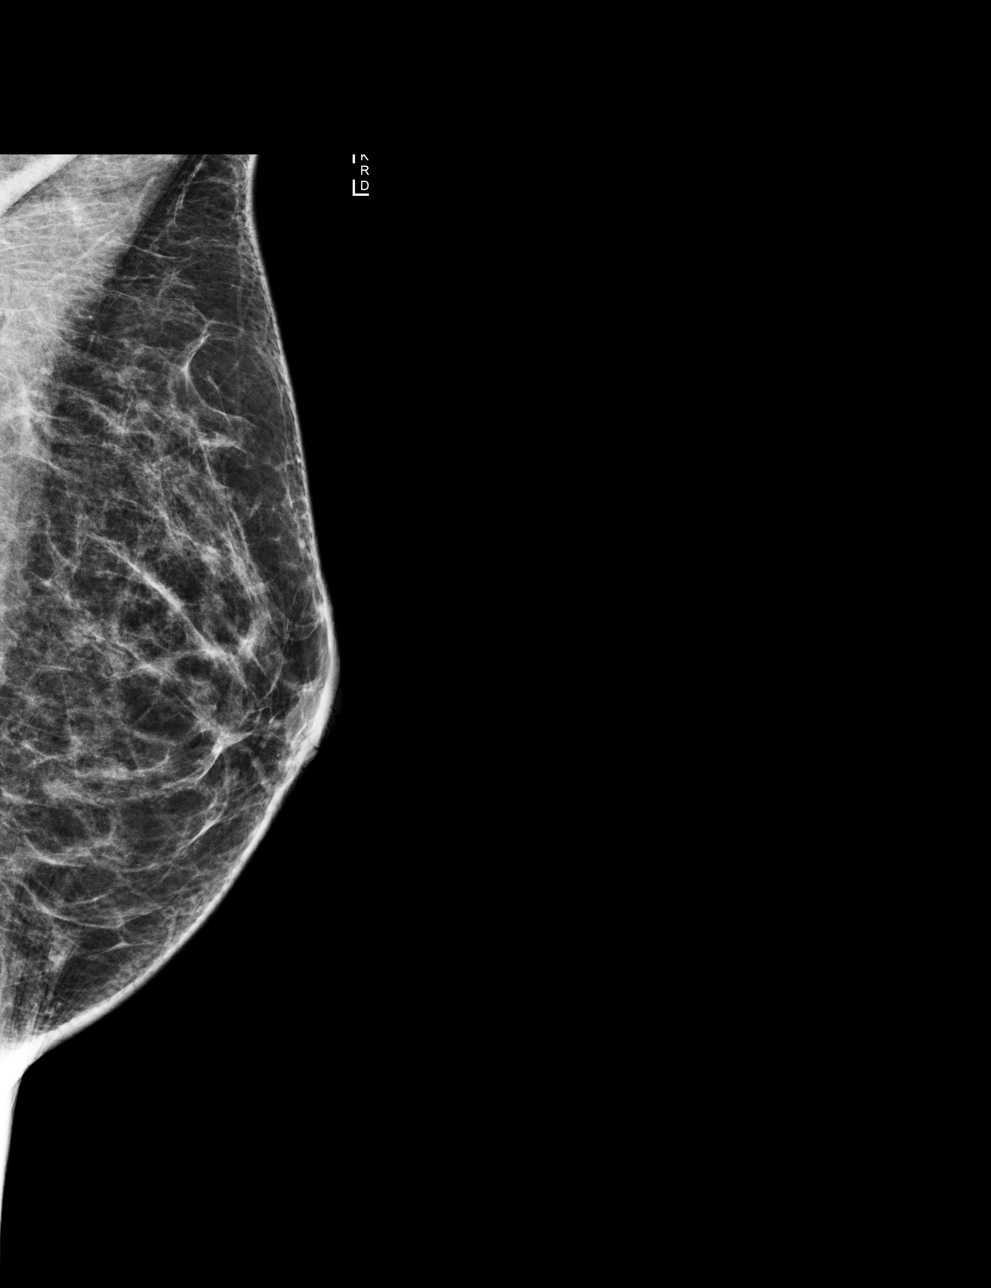

[R CC]
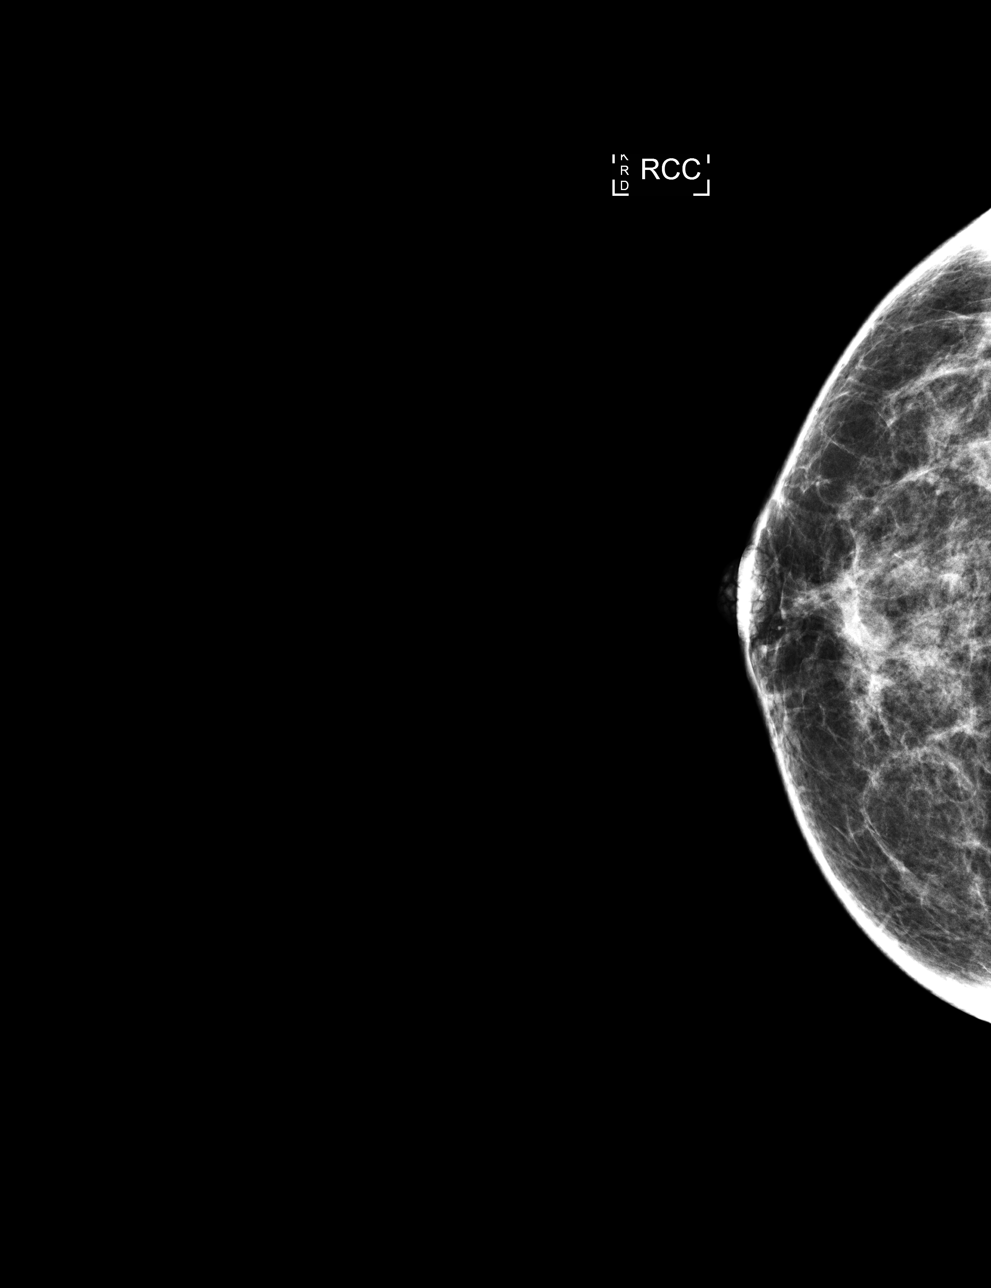

[R MLO]
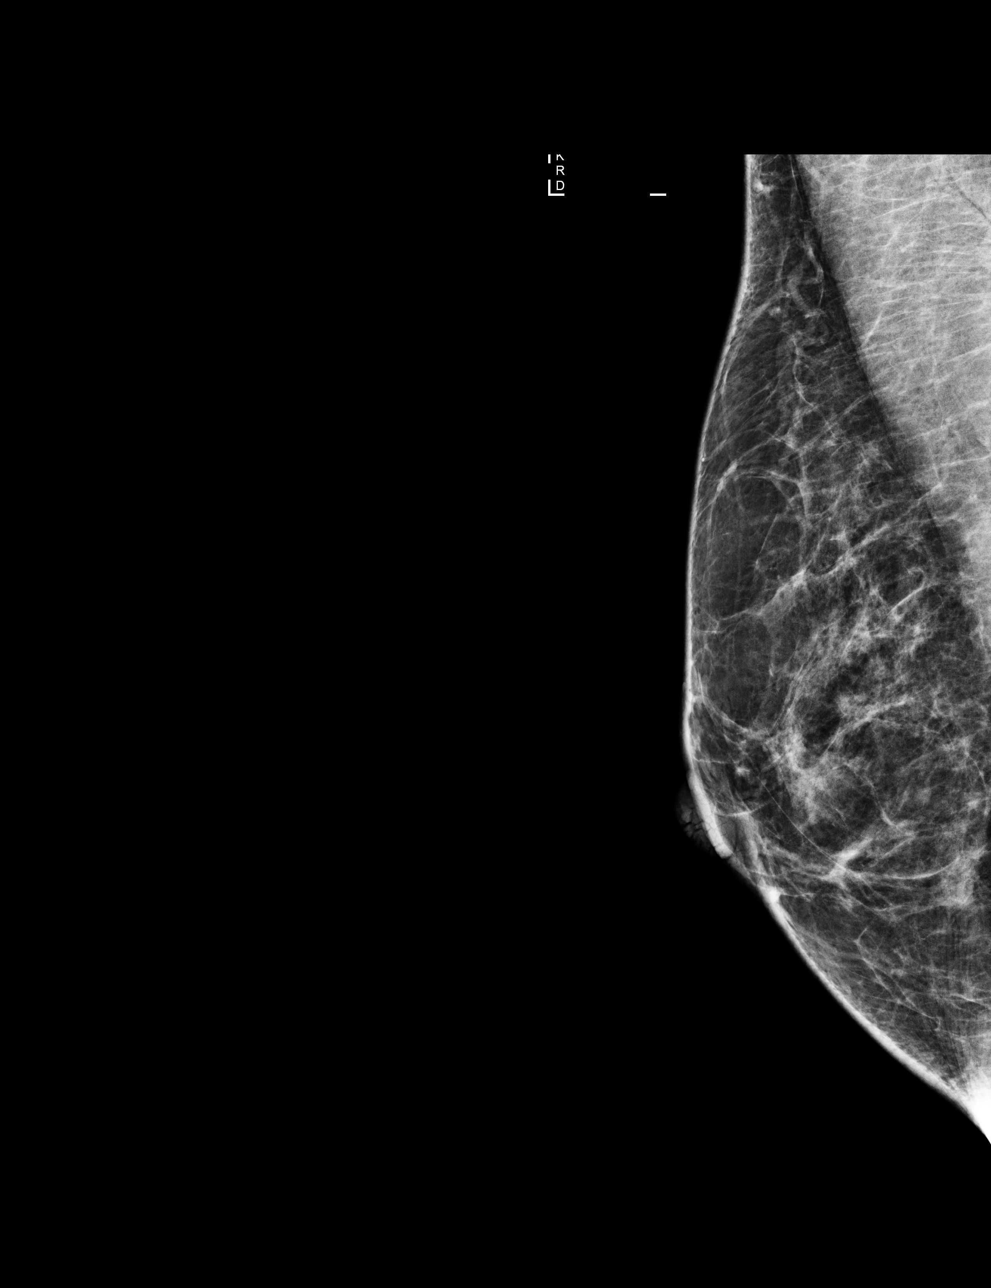

[4 of 4 positions shown; findings below may reference images not displayed]

ACR Breast Density Category b: There are scattered areas of
fibroglandular density.
FINDINGS: There are no findings suspicious for malignancy. Images were
processed with CAD.
IMPRESSION: No mammographic evidence of malignancy. A result letter of this
screening mammogram will be mailed directly to the patient.

RECOMMENDATION:
Screening mammogram in one year. (Code:AS-G-LCT)

BI-RADS CATEGORY  1: Negative.

## 2019-09-13 ENCOUNTER — Other Ambulatory Visit: Payer: Self-pay | Admitting: Obstetrics and Gynecology

## 2019-09-13 DIAGNOSIS — Z1231 Encounter for screening mammogram for malignant neoplasm of breast: Secondary | ICD-10-CM

## 2020-06-26 ENCOUNTER — Ambulatory Visit: Payer: Self-pay

## 2020-06-27 ENCOUNTER — Encounter: Payer: Self-pay | Admitting: Emergency Medicine

## 2020-06-27 ENCOUNTER — Other Ambulatory Visit: Payer: Self-pay

## 2020-06-27 ENCOUNTER — Ambulatory Visit
Admission: EM | Admit: 2020-06-27 | Discharge: 2020-06-27 | Disposition: A | Discharge status: Home / Self Care | Payer: Managed Care, Other (non HMO) | Attending: Sports Medicine | Admitting: Sports Medicine

## 2020-06-27 DIAGNOSIS — H6121 Impacted cerumen, right ear: Secondary | ICD-10-CM

## 2020-06-27 DIAGNOSIS — H6123 Impacted cerumen, bilateral: Secondary | ICD-10-CM | POA: Diagnosis not present

## 2020-06-27 NOTE — Discharge Instructions (Addendum)
Your change in hearing was caused by a buildup of earwax.  We remove the earwax today with a combination of a curette and irrigation.  The curette and irrigation caused a little abrasion to the floor of your ear canals on both sides.  Keep the area clean with hydrogen peroxide twice daily and apply a small amount of bacitracin on the end of a clean Q-tip just inside the ear to prevent infection.  Return for reevaluation if any new symptoms develop.

## 2020-06-27 NOTE — ED Provider Notes (Signed)
[facility]    CSN: [id number] Arrival date & time: 06/27/20  0808      History   Chief Complaint Chief Complaint  Patient presents with   Ear Fullness    HPI Alexis Wright is a 44 y.o. female.   HPI   44 year old female here for evaluation of right ear fullness.  Patient reports that she has had fullness in the right ear for the past week.  She states that it is causing muffled hearing.  Patient denies fever, runny nose, sinus pressure, sore throat, cough, dizziness, ringing in ears, or drainage from ears.  Patient has had this similar situation in the past but it typically self resolves in a day or so.  History reviewed. No pertinent past medical history.  There are no problems to display for this patient.   History reviewed. No pertinent surgical history.  OB History   No obstetric history on file.      Home Medications    Prior to Admission medications   Not on File    Family History Family History  Problem Relation Age of Onset   Breast cancer Neg Hx     Social History Social History   Tobacco Use   Smoking status: Never Smoker   Smokeless tobacco: Never Used  Building services engineer Use: Never used  Substance Use Topics   Alcohol use: No   Drug use: No     Allergies   Patient has no known allergies.   Review of Systems Review of Systems  HENT: Positive for hearing loss. Negative for congestion, ear pain, rhinorrhea, sinus pressure, sinus pain and sore throat.   Respiratory: Negative for cough.   Neurological: Negative for dizziness.     Physical Exam Triage Vital Signs ED Triage Vitals  Enc Vitals Group     BP      Pulse      Resp      Temp      Temp src      SpO2      Weight      Height      Head Circumference      Peak Flow      Pain Score      Pain Loc      Pain Edu?      Excl. in [place]?    No data found.  Updated Vital Signs BP (!) 144/78 (BP Location: Right Arm)    Pulse 68    Temp 98.7 F (37.1  C) (Oral)    Resp 18    Ht 5\' 6"  (1.676 m)    Wt 130 lb 1.1 oz (59 kg)    LMP 06/13/2020    SpO2 100%    BMI 20.99 kg/m   Visual Acuity Right Eye Distance:   Left Eye Distance:   Bilateral Distance:    Right Eye Near:   Left Eye Near:    Bilateral Near:     Physical Exam Constitutional:      General: She is not in acute distress.    Appearance: Normal appearance. She is normal weight.  HENT:     Head: Normocephalic and atraumatic.     Right Ear: There is impacted cerumen.     Left Ear: There is impacted cerumen.     Ears:     Comments: Bilateral EACs are occluded with a dark, gooey cerumen. Cardiovascular:     Rate and Rhythm: Normal rate and regular rhythm.  Pulses: Normal pulses.     Heart sounds: Normal heart sounds. No murmur heard. No gallop.   Pulmonary:     Effort: Pulmonary effort is normal.     Breath sounds: Normal breath sounds. No wheezing, rhonchi or rales.  Skin:    General: Skin is warm and dry.     Capillary Refill: Capillary refill takes less than 2 seconds.     Findings: No erythema or rash.  Neurological:     General: No focal deficit present.     Mental Status: She is alert and oriented to person, place, and time.  Psychiatric:        Mood and Affect: Mood normal.        Behavior: Behavior normal.        Thought Content: Thought content normal.        Judgment: Judgment normal.      UC Treatments / Results  Labs (all labs ordered are listed, but only abnormal results are displayed) Labs Reviewed - No data to display  EKG   Radiology No results found.  Procedures Procedures (including critical care time)  Medications Ordered in UC Medications - No data to display  Initial Impression / Assessment and Plan / UC Course  I have reviewed the triage vital signs and the nursing notes.  Pertinent labs & imaging results that were available during my care of the patient were reviewed by me and considered in my medical decision making (see  chart for details).   Patient is here for evaluation of right ear fullness for the past week that is causing muffled hearing.  Physical exam reveals bilateral EACs are occluded by a dark brown cerumen.  Cerumen partially cleared with a curette.  Patient tolerated the procedure well.  Patient still reports that she has some muffled hearing but that her hearing is improved on the right side.  Will order cerumen removal by irrigation and reevaluate.  Evaluation after bilateral ear irrigation reveals clear external auditory canals bilaterally.  There are small abrasions to the floor the canal from the cerumen removal.  Tympanic membranes are pearly gray with a normal light reflex bilaterally.  No signs of infection or effusion.  Patient directed to keep ears clean with Q-tips and hydroperoxide.  Patient also directed to apply a little bit of bacitracin on a clean Q-tip just inside the ear where the abrasion occurred to prevent infection.  Final Clinical Impressions(s) / UC Diagnoses   Final diagnoses:  Hearing loss of right ear due to cerumen impaction     Discharge Instructions     Your change in hearing was caused by a buildup of earwax.  We remove the earwax today with a combination of a curette and irrigation.  The curette and irrigation caused a little abrasion to the floor of your ear canals on both sides.  Keep the area clean with hydrogen peroxide twice daily and apply a small amount of bacitracin on the end of a clean Q-tip just inside the ear to prevent infection.  Return for reevaluation if any new symptoms develop.    ED Prescriptions    None     PDMP not reviewed this encounter.   Margarette Canada, NP 06/27/20 [id number]

## 2020-06-27 NOTE — ED Triage Notes (Signed)
Patient c/o ear fullness in her right ear that started about 1 week ago.

## 2021-04-03 ENCOUNTER — Encounter: Payer: Self-pay | Admitting: Emergency Medicine

## 2021-04-03 ENCOUNTER — Other Ambulatory Visit: Payer: Self-pay

## 2021-04-03 ENCOUNTER — Emergency Department: Payer: No Typology Code available for payment source

## 2021-04-03 ENCOUNTER — Emergency Department
Admission: EM | Admit: 2021-04-03 | Discharge: 2021-04-03 | Disposition: A | Discharge status: Home / Self Care | Payer: No Typology Code available for payment source | Attending: Emergency Medicine | Admitting: Emergency Medicine

## 2021-04-03 DIAGNOSIS — R0789 Other chest pain: Secondary | ICD-10-CM | POA: Insufficient documentation

## 2021-04-03 DIAGNOSIS — R079 Chest pain, unspecified: Secondary | ICD-10-CM

## 2021-04-03 LAB — HEPATIC FUNCTION PANEL
ALT: 11 U/L (ref 0–44)
AST: 17 U/L (ref 15–41)
Albumin: 4.3 g/dL (ref 3.5–5.0)
Alkaline Phosphatase: 50 U/L (ref 38–126)
Bilirubin, Direct: 0.2 mg/dL (ref 0.0–0.2)
Indirect Bilirubin: 1.5 mg/dL — ABNORMAL HIGH (ref 0.3–0.9)
Total Bilirubin: 1.7 mg/dL — ABNORMAL HIGH (ref 0.3–1.2)
Total Protein: 8 g/dL (ref 6.5–8.1)

## 2021-04-03 LAB — CBC
HCT: 40.6 % (ref 36.0–46.0)
Hemoglobin: 13.4 g/dL (ref 12.0–15.0)
MCH: 29.6 pg (ref 26.0–34.0)
MCHC: 33 g/dL (ref 30.0–36.0)
MCV: 89.6 fL (ref 80.0–100.0)
Platelets: 327 10*3/uL (ref 150–400)
RBC: 4.53 MIL/uL (ref 3.87–5.11)
RDW: 13.7 % (ref 11.5–15.5)
WBC: 12.6 10*3/uL — ABNORMAL HIGH (ref 4.0–10.5)
nRBC: 0 % (ref 0.0–0.2)

## 2021-04-03 LAB — TSH: TSH: 3.054 u[IU]/mL (ref 0.350–4.500)

## 2021-04-03 LAB — BASIC METABOLIC PANEL
Anion gap: 13 (ref 5–15)
BUN: 16 mg/dL (ref 6–20)
CO2: 27 mmol/L (ref 22–32)
Calcium: 9.6 mg/dL (ref 8.9–10.3)
Chloride: 100 mmol/L (ref 98–111)
Creatinine, Ser: 0.7 mg/dL (ref 0.44–1.00)
GFR, Estimated: >60 mL/min (ref >60)
Glucose, Bld: 110 mg/dL — ABNORMAL HIGH (ref 70–99)
Potassium: 3.8 mmol/L (ref 3.5–5.1)
Sodium: 140 mmol/L (ref 135–145)

## 2021-04-03 LAB — TROPONIN I (HIGH SENSITIVITY): Troponin I (High Sensitivity): 3 ng/L (ref <18)

## 2021-04-03 MED ORDER — HYDROXYZINE HCL 25 MG PO TABS: 25.0000 mg | ORAL_TABLET | Freq: Three times a day (TID) | ORAL | 0 refills | Status: AC | PRN

## 2021-04-03 NOTE — ED Notes (Signed)
See triage note  presents with several days of intermittent chest pain and burning  states sxs' are worse at night   afebrile

## 2021-04-03 NOTE — ED Triage Notes (Signed)
Pt comes into the ED via POv c/o right side chest pain that radiates into the back.  Pt states she has no SHOB or dizziness but has had some nausea.  Pt denies any cardiac history.  Pt ambulatory with even and unlabored respirations.

## 2021-04-03 NOTE — ED Provider Notes (Signed)
[facility] Emergency Department Provider Note  Time seen: 11:04 AM  I have reviewed the triage vital signs and the nursing notes.   HISTORY  Chief Complaint Chest Pain   HPI Alexis Wright is a 45 y.o. female with no past medical history, no family cardiac history, presents to the emergency department for intermittent chest pain.  According to the patient over the past 2 to 3 days she has been experiencing intermittent chest pain in her chest.  States she has had these pains previously but they only last several seconds or minutes and then go away.  She states over the weekend they were lasting for hours and last night it awoke her from her sleep.  Patient describes a dull pain to the center of her chest, denies any nausea diaphoresis or shortness of breath.  Patient states she only notices the chest pain at night when she is lying down.  Denies any known history of gastric reflux.  Patient states she has seen her doctor for similar symptoms in the past and they have told her that is likely anxiety.  They prescribed her hydrochlorothiazide which the patient states she takes as needed.  Blood pressure is normal today.   History reviewed. No pertinent past medical history.  There are no problems to display for this patient.   History reviewed. No pertinent surgical history.  Prior to Admission medications   Medication Sig Start Date End Date Taking? Authorizing Provider  hydrOXYzine (ATARAX/VISTARIL) 25 MG tablet Take 1 tablet (25 mg total) by mouth 3 (three) times daily as needed for anxiety. 04/03/21  Yes Harvest Dark, MD    No Known Allergies  Family History  Problem Relation Age of Onset   Breast cancer Neg Hx     Social History Social History   Tobacco Use   Smoking status: Never   Smokeless tobacco: Never  Vaping Use   Vaping Use: Never used  Substance Use Topics   Alcohol use: No   Drug use: No    Review of Systems Constitutional:  Negative for fever. Cardiovascular: Intermittent central chest pains.  None currently. Respiratory: Negative for shortness of breath.  No cough. Gastrointestinal: Negative for abdominal pain, vomiting and diarrhea. Musculoskeletal: Negative for musculoskeletal complaints.  No leg pain or swelling. Skin: Negative for skin complaints  Neurological: Negative for headache All other ROS negative  ____________________________________________   PHYSICAL EXAM:  VITAL SIGNS: ED Triage Vitals  Enc Vitals Group     BP 04/03/21 0750 131/88     Pulse Rate 04/03/21 0750 73     Resp 04/03/21 0750 17     Temp 04/03/21 0750 98.8 F (37.1 C)     Temp Source 04/03/21 0750 Oral     SpO2 04/03/21 0750 100 %     Weight 04/03/21 0747 130 lb 1.1 oz (59 kg)     Height 04/03/21 0747 5\' 6"  (1.676 m)     Head Circumference --      Peak Flow --      Pain Score 04/03/21 0747 2     Pain Loc --      Pain Edu? --      Excl. in [place]? --    Constitutional: Alert and oriented. Well appearing and in no distress. Eyes: Normal exam ENT      Head: Normocephalic and atraumatic.      Mouth/Throat: Mucous membranes are moist. Cardiovascular: Normal rate, regular rhythm.  Respiratory: Normal respiratory effort without tachypnea nor retractions.  Breath sounds are clear Gastrointestinal: Soft and nontender. No distention.   Musculoskeletal: Nontender with normal range of motion in all extremities. No lower extremity tenderness or edema. Neurologic:  Normal speech and language. No gross focal neurologic deficits Skin:  Skin is warm, dry and intact.  Psychiatric: Mood and affect are normal.   ____________________________________________    EKG  EKG viewed and interpreted by myself shows normal sinus rhythm at 78 bpm with a narrow QRS, normal axis, normal intervals, nonspecific but no concerning ST changes.  ____________________________________________    RADIOLOGY  Chest x-ray is  clear  ____________________________________________   INITIAL IMPRESSION / ASSESSMENT AND PLAN / ED COURSE  Pertinent labs & imaging results that were available during my care of the patient were reviewed by me and considered in my medical decision making (see chart for details).   Patient presents emergency department for intermittent chest pains over the past several days.  Overall the patient appears well, reassuring physical exam.  Reassuring lab work including a negative troponin, normal TSH, normal chest x-ray and no significant findings on EKG.  Patient only notices the symptoms at night when lying down.  I did discuss with the patient a trial of Maalox before going to bed as well as taking hydroxyzine if needed for symptoms to see if this helps.  Also discussed following up with her doctor to discuss this further and the possibility of a stress test.  Patient agreeable to this plan of care.  Given the patient's reassuring work-up I do believe she is safe for discharge home.  Alexis Wright was evaluated in Emergency Department on 04/03/2021 for the symptoms described in the history of present illness. She was evaluated in the context of the global COVID-19 pandemic, which necessitated consideration that the patient might be at risk for infection with the SARS-CoV-2 virus that causes COVID-19. Institutional protocols and algorithms that pertain to the evaluation of patients at risk for COVID-19 are in a state of rapid change based on information released by regulatory bodies including the CDC and federal and state organizations. These policies and algorithms were followed during the patient's care in the ED.  ____________________________________________   FINAL CLINICAL IMPRESSION(S) / ED DIAGNOSES  Chest pain   Harvest Dark, MD 04/03/21 1108

## 2021-04-03 NOTE — Discharge Instructions (Signed)
As discussed please take your hydroxyzine, as needed, as prescribed.  Please also take 1 capful of liquid Maalox 30 minutes before going to sleep.  Please follow-up with your doctor in the next several days for recheck/reevaluation.  Return to the emergency department if your chest pain returns/worsens, you develop shortness of breath, or any other symptom personally concerning to yourself.

## 2021-04-03 NOTE — ED Provider Notes (Signed)
Emergency Medicine Provider Triage Evaluation Note  Monisha Siebel , a 45 y.o. female  was evaluated in triage.  Pt complains of intermittent chest pain radiates into her back, symptoms started last night.  Review of Systems  Positive: Chest pain Negative: Shortness of breath, abdominal pain, vomiting or diarrhea  Physical Exam  BP 131/88 (BP Location: Right Arm)   Pulse 73   Temp 98.8 F (37.1 C) (Oral)   Resp 17   Ht 5\' 6"  (1.676 m)   Wt 59 kg   SpO2 100%   BMI 20.99 kg/m  Gen:   Awake, no distress   Resp:  Normal effort  MSK:   Moves extremities without difficulty  Other:    Medical Decision Making  Medically screening exam initiated at 8:03 AM.  Appropriate orders placed.  Jazyiah Yiu was informed that the remainder of the evaluation will be completed by another provider, this initial triage assessment does not replace that evaluation, and the importance of remaining in the ED until their evaluation is complete.     Versie Starks, PA-C 04/03/21 3419    Blake Divine, MD 04/03/21 [phone]

## 2021-10-24 DIAGNOSIS — Z0000 Encounter for general adult medical examination without abnormal findings: Secondary | ICD-10-CM | POA: Diagnosis not present

## 2021-10-28 DIAGNOSIS — Z800 Family history of malignant neoplasm of digestive organs: Secondary | ICD-10-CM | POA: Diagnosis not present

## 2021-10-28 DIAGNOSIS — Z0000 Encounter for general adult medical examination without abnormal findings: Secondary | ICD-10-CM | POA: Diagnosis not present

## 2021-10-28 DIAGNOSIS — K2990 Gastroduodenitis, unspecified, without bleeding: Secondary | ICD-10-CM | POA: Diagnosis not present

## 2021-11-05 DIAGNOSIS — R0789 Other chest pain: Secondary | ICD-10-CM | POA: Diagnosis not present

## 2021-11-05 DIAGNOSIS — Z1211 Encounter for screening for malignant neoplasm of colon: Secondary | ICD-10-CM | POA: Diagnosis not present

## 2021-11-05 DIAGNOSIS — K219 Gastro-esophageal reflux disease without esophagitis: Secondary | ICD-10-CM | POA: Diagnosis not present

## 2021-11-05 DIAGNOSIS — Z800 Family history of malignant neoplasm of digestive organs: Secondary | ICD-10-CM | POA: Diagnosis not present

## 2021-11-05 DIAGNOSIS — R1013 Epigastric pain: Secondary | ICD-10-CM | POA: Diagnosis not present

## 2022-01-17 DIAGNOSIS — M79605 Pain in left leg: Secondary | ICD-10-CM | POA: Diagnosis not present

## 2022-02-05 ENCOUNTER — Encounter: Payer: Self-pay | Admitting: Gastroenterology

## 2022-02-05 NOTE — H&P (Signed)
Pre-Procedure H&P   Patient ID: Alexis Wright is a 46 y.o. female.  Gastroenterology Provider: Annamaria Helling, DO  PCP: Rusty Aus, MD  Date: 02/06/2022  HPI Ms. Alexis Wright is a 46 y.o. female who presents today for Esophagogastroduodenoscopy and Colonoscopy for gastritis, duodenitis, fhx gastric cancer; initial colon cancer screening.  Patient with several months of epigastric discomfort lasting a few seconds to a few hours.  Change in diet did help this as she is avoiding dairy and sugars.  She does note reflux without dysphagia odynophagia nausea or vomiting.  Currently taking omeprazole 20 mg daily.  She does not note that her discomfort is postprandial or BM related.  Appetite and weight have been stable  Initial screening colonoscopy.  Bowel movements are regular without melena hematochezia diarrhea or constipation.  Denies NSAIDs antiplatelets anticoagulants No previous scope A1c 5.6 B12 500 hemoglobin 12.1 MCV 91 platelets there is a 32,000 creatinine 0.7  History reviewed. No pertinent past medical history.  Past Surgical History:  Procedure Laterality Date   DENTAL SURGERY      Family History Mother- gastric ca- 19 y/o dx No h/o GI disease or malignancy  Review of Systems  Constitutional:  Negative for activity change, appetite change, chills, diaphoresis, fatigue, fever and unexpected weight change.  HENT:  Negative for trouble swallowing and voice change.   Respiratory:  Negative for shortness of breath and wheezing.   Cardiovascular:  Negative for chest pain, palpitations and leg swelling.  Gastrointestinal:  Positive for abdominal pain. Negative for abdominal distention, anal bleeding, blood in stool, constipation, diarrhea, nausea, rectal pain and vomiting.  Musculoskeletal:  Negative for arthralgias and myalgias.  Skin:  Negative for color change and pallor.  Neurological:  Negative for dizziness, syncope and weakness.  Psychiatric/Behavioral:   Negative for confusion.   All other systems reviewed and are negative.    Medications No current facility-administered medications on file prior to encounter.   Current Outpatient Medications on File Prior to Encounter  Medication Sig Dispense Refill   hydrOXYzine (ATARAX/VISTARIL) 25 MG tablet Take 1 tablet (25 mg total) by mouth 3 (three) times daily as needed for anxiety. 20 tablet 0    Pertinent medications related to GI and procedure were reviewed by me with the patient prior to the procedure   Current Facility-Administered Medications:    0.9 %  sodium chloride infusion, , Intravenous, Continuous, Annamaria Helling, DO, Last Rate: 20 mL/hr at 02/06/22 0715, New Bag at 02/06/22 0715      No Known Allergies Allergies were reviewed by me prior to the procedure  Objective   Body mass index is 24.21 kg/m. Vitals:   02/06/22 0702  BP: (!) 143/77  Pulse: 69  Resp: 16  Temp: 97.6 F (36.4 C)  TempSrc: Temporal  SpO2: 100%  Weight: 68 kg  Height: '5\' 6"'$  (1.676 m)     Physical Exam Vitals and nursing note reviewed.  Constitutional:      General: She is not in acute distress.    Appearance: Normal appearance. She is not ill-appearing, toxic-appearing or diaphoretic.  HENT:     Head: Normocephalic and atraumatic.     Nose: Nose normal.     Mouth/Throat:     Mouth: Mucous membranes are moist.     Pharynx: Oropharynx is clear.  Eyes:     General: No scleral icterus.    Extraocular Movements: Extraocular movements intact.  Cardiovascular:     Rate and Rhythm: Normal rate and  regular rhythm.     Heart sounds: Normal heart sounds. No murmur heard.    No friction rub. No gallop.  Pulmonary:     Effort: Pulmonary effort is normal. No respiratory distress.     Breath sounds: Normal breath sounds. No wheezing, rhonchi or rales.  Abdominal:     General: Abdomen is flat. Bowel sounds are normal. There is no distension.     Palpations: Abdomen is soft.      Tenderness: There is no abdominal tenderness. There is no guarding or rebound.  Musculoskeletal:     Cervical back: Neck supple.     Right lower leg: No edema.     Left lower leg: No edema.  Skin:    General: Skin is warm and dry.     Coloration: Skin is not jaundiced or pale.  Neurological:     General: No focal deficit present.     Mental Status: She is alert and oriented to person, place, and time. Mental status is at baseline.  Psychiatric:        Mood and Affect: Mood normal.        Behavior: Behavior normal.        Thought Content: Thought content normal.        Judgment: Judgment normal.      Assessment:  Ms. Alexis Wright is a 46 y.o. female  who presents today for Esophagogastroduodenoscopy and Colonoscopy for gastritis, duodenitis, fhx gastric cancer; initial colon cancer screening.  Plan:  Esophagogastroduodenoscopy and Colonoscopy with possible intervention today  Esophagogastroduodenoscopy and Colonoscopy with possible biopsy, control of bleeding, polypectomy, and interventions as necessary has been discussed with the patient/patient representative. Informed consent was obtained from the patient/patient representative after explaining the indication, nature, and risks of the procedure including but not limited to death, bleeding, perforation, missed neoplasm/lesions, cardiorespiratory compromise, and reaction to medications. Opportunity for questions was given and appropriate answers were provided. Patient/patient representative has verbalized understanding is amenable to undergoing the procedure.   Annamaria Helling, DO  [facility]  Portions of the record may have been created with voice recognition software. Occasional wrong-word or 'sound-a-like' substitutions may have occurred due to the inherent limitations of voice recognition software.  Read the chart carefully and recognize, using context, where substitutions may have occurred.

## 2022-02-06 ENCOUNTER — Encounter
Admission: RE | Disposition: A | Discharge status: Home / Self Care | Payer: Self-pay | Source: Home / Self Care | Attending: Gastroenterology

## 2022-02-06 ENCOUNTER — Ambulatory Visit
Admission: RE | Admit: 2022-02-06 | Discharge: 2022-02-06 | Disposition: A | Discharge status: Home / Self Care | Payer: 59 | Attending: Gastroenterology | Admitting: Gastroenterology

## 2022-02-06 ENCOUNTER — Ambulatory Visit: Payer: 59 | Admitting: Anesthesiology

## 2022-02-06 ENCOUNTER — Encounter: Payer: Self-pay | Admitting: Gastroenterology

## 2022-02-06 DIAGNOSIS — K449 Diaphragmatic hernia without obstruction or gangrene: Secondary | ICD-10-CM | POA: Insufficient documentation

## 2022-02-06 DIAGNOSIS — K2980 Duodenitis without bleeding: Secondary | ICD-10-CM | POA: Insufficient documentation

## 2022-02-06 DIAGNOSIS — K3189 Other diseases of stomach and duodenum: Secondary | ICD-10-CM | POA: Diagnosis not present

## 2022-02-06 DIAGNOSIS — K621 Rectal polyp: Secondary | ICD-10-CM | POA: Insufficient documentation

## 2022-02-06 DIAGNOSIS — D122 Benign neoplasm of ascending colon: Secondary | ICD-10-CM | POA: Insufficient documentation

## 2022-02-06 DIAGNOSIS — Z79899 Other long term (current) drug therapy: Secondary | ICD-10-CM | POA: Diagnosis not present

## 2022-02-06 DIAGNOSIS — K640 First degree hemorrhoids: Secondary | ICD-10-CM | POA: Insufficient documentation

## 2022-02-06 DIAGNOSIS — D128 Benign neoplasm of rectum: Secondary | ICD-10-CM | POA: Diagnosis not present

## 2022-02-06 DIAGNOSIS — K2970 Gastritis, unspecified, without bleeding: Secondary | ICD-10-CM | POA: Diagnosis not present

## 2022-02-06 DIAGNOSIS — R1013 Epigastric pain: Secondary | ICD-10-CM | POA: Diagnosis not present

## 2022-02-06 DIAGNOSIS — Z1211 Encounter for screening for malignant neoplasm of colon: Secondary | ICD-10-CM | POA: Insufficient documentation

## 2022-02-06 DIAGNOSIS — K5730 Diverticulosis of large intestine without perforation or abscess without bleeding: Secondary | ICD-10-CM | POA: Insufficient documentation

## 2022-02-06 DIAGNOSIS — Z800 Family history of malignant neoplasm of digestive organs: Secondary | ICD-10-CM | POA: Diagnosis not present

## 2022-02-06 DIAGNOSIS — B9681 Helicobacter pylori [H. pylori] as the cause of diseases classified elsewhere: Secondary | ICD-10-CM | POA: Diagnosis not present

## 2022-02-06 DIAGNOSIS — K2900 Acute gastritis without bleeding: Secondary | ICD-10-CM | POA: Diagnosis not present

## 2022-02-06 DIAGNOSIS — D126 Benign neoplasm of colon, unspecified: Secondary | ICD-10-CM | POA: Diagnosis not present

## 2022-02-06 HISTORY — PX: COLONOSCOPY WITH PROPOFOL: SHX5780

## 2022-02-06 HISTORY — PX: ESOPHAGOGASTRODUODENOSCOPY (EGD) WITH PROPOFOL: SHX5813

## 2022-02-06 LAB — POCT PREGNANCY, URINE: Preg Test, Ur: NEGATIVE

## 2022-02-06 SURGERY — COLONOSCOPY WITH PROPOFOL
Anesthesia: General

## 2022-02-06 MED ORDER — MIDAZOLAM HCL 2 MG/2ML IJ SOLN
INTRAMUSCULAR | Status: DC | PRN
  Administered 2022-02-06: 2 mg via INTRAVENOUS

## 2022-02-06 MED ORDER — MIDAZOLAM HCL 2 MG/2ML IJ SOLN
INTRAMUSCULAR | Status: AC
  Filled 2022-02-06: qty 2

## 2022-02-06 MED ORDER — LIDOCAINE HCL (PF) 2 % IJ SOLN
INTRAMUSCULAR | Status: AC
  Filled 2022-02-06: qty 10

## 2022-02-06 MED ORDER — SODIUM CHLORIDE 0.9 % IV SOLN: INTRAVENOUS | Status: DC

## 2022-02-06 MED ORDER — PROPOFOL 1000 MG/100ML IV EMUL
INTRAVENOUS | Status: AC
  Filled 2022-02-06: qty 100

## 2022-02-06 MED ORDER — LIDOCAINE HCL (CARDIAC) PF 100 MG/5ML IV SOSY
PREFILLED_SYRINGE | INTRAVENOUS | Status: DC | PRN
  Administered 2022-02-06: 60 mg via INTRAVENOUS

## 2022-02-06 MED ORDER — PROPOFOL 500 MG/50ML IV EMUL
INTRAVENOUS | Status: DC | PRN
  Administered 2022-02-06: 100 ug/kg/min via INTRAVENOUS

## 2022-02-06 MED ORDER — PROPOFOL 10 MG/ML IV BOLUS
INTRAVENOUS | Status: DC | PRN
  Administered 2022-02-06: 50 mg via INTRAVENOUS

## 2022-02-06 NOTE — Anesthesia Postprocedure Evaluation (Signed)
Anesthesia Post Note  Patient: Alexis Wright  Procedure(s) Performed: COLONOSCOPY WITH PROPOFOL ESOPHAGOGASTRODUODENOSCOPY (EGD) WITH PROPOFOL  Patient location during evaluation: Endoscopy Anesthesia Type: General Level of consciousness: awake and alert Pain management: pain level controlled Vital Signs Assessment: post-procedure vital signs reviewed and stable Respiratory status: spontaneous breathing, nonlabored ventilation, respiratory function stable and patient connected to nasal cannula oxygen Cardiovascular status: blood pressure returned to baseline and stable Postop Assessment: no apparent nausea or vomiting Anesthetic complications: no   No notable events documented.   Last Vitals:  Vitals:   02/06/22 0821 02/06/22 0831  BP: 125/75 131/83  Pulse: 73 (!) 59  Resp: 17 15  Temp:    SpO2: 100% 100%    Last Pain:  Vitals:   02/06/22 0821  TempSrc:   PainSc: 0-No pain                 Dimas Millin

## 2022-02-06 NOTE — Op Note (Signed)
[facility] Gastroenterology Patient Name: Alexis Wright Procedure Date: 02/06/2022 7:33 AM MRN: [id number] Account #: [id number] Date of Birth: 1975/08/29 Admit Type: Outpatient Age: 46 Room: [facility] ENDO ROOM 3 Gender: Female Note Status: Finalized Instrument Name: Upper Endoscope [id number] Procedure:             Upper GI endoscopy Indications:           Epigastric abdominal pain, Dyspepsia Providers:             Rueben Bash, DO Referring MD:          Rusty Aus, MD (Referring MD) Medicines:             Monitored Anesthesia Care Complications:         No immediate complications. Estimated blood loss:                         Minimal. Procedure:             Pre-Anesthesia Assessment:                        - Prior to the procedure, a History and Physical was                         performed, and patient medications and allergies were                         reviewed. The patient is competent. The risks and                         benefits of the procedure and the sedation options and                         risks were discussed with the patient. All questions                         were answered and informed consent was obtained.                         Patient identification and proposed procedure were                         verified by the nurse, the anesthetist and the                         technician in the endoscopy suite. Mental Status                         Examination: alert and oriented. Airway Examination:                         normal oropharyngeal airway and neck mobility.                         Respiratory Examination: clear to auscultation. CV                         Examination: RRR, no murmurs, no S3 or S4.  Prophylactic Antibiotics: The patient does not require                         prophylactic antibiotics. Prior Anticoagulants: The                         patient has taken no previous anticoagulant or                          antiplatelet agents. ASA Grade Assessment: II - A                         patient with mild systemic disease. After reviewing                         the risks and benefits, the patient was deemed in                         satisfactory condition to undergo the procedure. The                         anesthesia plan was to use monitored anesthesia care                         (MAC). Immediately prior to administration of                         medications, the patient was re-assessed for adequacy                         to receive sedatives. The heart rate, respiratory                         rate, oxygen saturations, blood pressure, adequacy of                         pulmonary ventilation, and response to care were                         monitored throughout the procedure. The physical                         status of the patient was re-assessed after the                         procedure.                        After obtaining informed consent, the endoscope was                         passed under direct vision. Throughout the procedure,                         the patient's blood pressure, pulse, and oxygen                         saturations were monitored continuously. The Endoscope  was introduced through the mouth, and advanced to the                         second part of duodenum. The upper GI endoscopy was                         accomplished without difficulty. The patient tolerated                         the procedure well. Findings:      Localized mildly erythematous mucosa without active bleeding and with no       stigmata of bleeding was found in the duodenal bulb. Biopsies were taken       with a cold forceps for histology. Estimated blood loss was minimal.      The exam of the duodenum was otherwise normal.      Multiple localized 1 to 3 mm erosions with no bleeding and no stigmata       of recent bleeding were found in  the gastric antrum. Healing. Biopsies       were taken with a cold forceps for Helicobacter pylori testing.       Estimated blood loss was minimal.      A 2 cm hiatal hernia was present. Estimated blood loss: none.      The exam of the stomach was otherwise normal.      The Z-line was regular. Estimated blood loss: none.      Esophagogastric landmarks were identified: the gastroesophageal junction       was found at 36 cm from the incisors.      The exam of the esophagus was otherwise normal.      A single area of ectopic gastric mucosa was found in the upper third of       the esophagus. Estimated blood loss: none. Impression:            - Erythematous duodenopathy. Biopsied.                        - Erosive gastropathy with no bleeding and no stigmata                         of recent bleeding. Biopsied.                        - 2 cm hiatal hernia.                        - Z-line regular.                        - Esophagogastric landmarks identified.                        - Ectopic gastric mucosa in the upper third of the                         esophagus. Recommendation:        - Patient has a contact number available for                         emergencies. The signs and symptoms of potential  delayed complications were discussed with the patient.                         Return to normal activities tomorrow. Written                         discharge instructions were provided to the patient.                        - Discharge patient to home.                        - Resume previous diet.                        - Continue present medications.                        - increase proton pump inhibitor to 40 mg twice a day                         for 8 weeks.                        - Await pathology results.                        - Return to GI clinic as previously scheduled.                        - proceed wtih colonoscopy                        - No aspirin,  ibuprofen, naproxen, or other                         non-steroidal anti-inflammatory drugs.                        - The findings and recommendations were discussed with                         the patient. Procedure Code(s):     --- Professional ---                        [phone], Esophagogastroduodenoscopy, flexible,                         transoral; with biopsy, single or multiple Diagnosis Code(s):     --- Professional ---                        K31.89, Other diseases of stomach and duodenum                        K44.9, Diaphragmatic hernia without obstruction or                         gangrene                        R10.13, Epigastric pain CPT copyright 2019 [place]  [employer]. All rights reserved. The codes documented in this report are preliminary and upon coder review may  be revised to meet current compliance requirements. Attending Participation:      I personally performed the entire procedure. Volney American, DO Annamaria Helling DO, DO 02/06/2022 7:47:46 AM This report has been signed electronically. Number of Addenda: 0 Note Initiated On: 02/06/2022 7:33 AM Estimated Blood Loss:  Estimated blood loss was minimal.      [facility]

## 2022-02-06 NOTE — Interval H&P Note (Signed)
History and Physical Interval Note: Preprocedure H&P from 02/06/22  was reviewed and there was no interval change after seeing and examining the patient.  Written consent was obtained from the patient after discussion of risks, benefits, and alternatives. Patient has consented to proceed with Esophagogastroduodenoscopy and Colonoscopy with possible intervention   02/06/2022 7:29 AM  Alexis Wright  has presented today for surgery, with the diagnosis of Z12.11  - Encounter for screening colonoscopy for non-high-risk patient R07.89  - Atypical chest pain K21.9  - Gastroesophageal reflux disease, unspecified whether esophagitis present.  The various methods of treatment have been discussed with the patient and family. After consideration of risks, benefits and other options for treatment, the patient has consented to  Procedure(s) with comments: COLONOSCOPY WITH PROPOFOL (N/A) - USE PED COLONOSCOPE ESOPHAGOGASTRODUODENOSCOPY (EGD) WITH PROPOFOL (N/A) as a surgical intervention.  The patient's history has been reviewed, patient examined, no change in status, stable for surgery.  I have reviewed the patient's chart and labs.  Questions were answered to the patient's satisfaction.     Annamaria Helling

## 2022-02-06 NOTE — Op Note (Addendum)
[facility] Gastroenterology Patient Name: Alexis Wright Procedure Date: 02/06/2022 7:31 AM MRN: [id number] Account #: [id number] Date of Birth: Oct 20, 1975 Admit Type: Outpatient Age: 46 Room: [facility] ENDO ROOM 3 Gender: Female Note Status: Addendum Instrument Name: Peds Colonoscope [id number] Procedure:             Colonoscopy Indications:           Screening for colorectal malignant neoplasm Providers:             Annamaria Helling DO, DO Referring MD:          Rusty Aus, MD (Referring MD) Medicines:             Monitored Anesthesia Care Complications:         No immediate complications. Estimated blood loss:                         Minimal. Procedure:             Pre-Anesthesia Assessment:                        - Prior to the procedure, a History and Physical was                         performed, and patient medications and allergies were                         reviewed. The patient is competent. The risks and                         benefits of the procedure and the sedation options and                         risks were discussed with the patient. All questions                         were answered and informed consent was obtained.                         Patient identification and proposed procedure were                         verified by the physician, the nurse, the anesthetist                         and the technician in the endoscopy suite. Mental                         Status Examination: alert and oriented. Airway                         Examination: normal oropharyngeal airway and neck                         mobility. Respiratory Examination: clear to                         auscultation. CV Examination: RRR, no murmurs, no S3  or S4. Prophylactic Antibiotics: The patient does not                         require prophylactic antibiotics. Prior                         Anticoagulants: The patient has taken no previous                          anticoagulant or antiplatelet agents. ASA Grade                         Assessment: II - A patient with mild systemic disease.                         After reviewing the risks and benefits, the patient                         was deemed in satisfactory condition to undergo the                         procedure. The anesthesia plan was to use monitored                         anesthesia care (MAC). Immediately prior to                         administration of medications, the patient was                         re-assessed for adequacy to receive sedatives. The                         heart rate, respiratory rate, oxygen saturations,                         blood pressure, adequacy of pulmonary ventilation, and                         response to care were monitored throughout the                         procedure. The physical status of the patient was                         re-assessed after the procedure.                        After obtaining informed consent, the colonoscope was                         passed under direct vision. Throughout the procedure,                         the patient's blood pressure, pulse, and oxygen                         saturations were monitored continuously. The  Colonoscope was introduced through the anus and                         advanced to the the terminal ileum, with                         identification of the appendiceal orifice and IC                         valve. The colonoscopy was performed without                         difficulty. The patient tolerated the procedure well.                         The quality of the bowel preparation was evaluated                         using the BBPS [facility] Bowel Preparation Scale) with                         scores of: Right Colon = 3, Transverse Colon = 3 and                         Left Colon = 3 (entire mucosa seen well with no                          residual staining, small fragments of stool or opaque                         liquid). The total BBPS score equals 9. The terminal                         ileum, ileocecal valve, appendiceal orifice, and                         rectum were photographed. Findings:      The perianal and digital rectal examinations were normal. Pertinent       negatives include normal sphincter tone.      The terminal ileum appeared normal. Estimated blood loss: none.      A few small-mouthed diverticula were found in the recto-sigmoid colon.       Estimated blood loss: none.      Two sessile polyps were found in the rectum and ascending colon. The       polyps were 1 to 2 mm in size. Estimated blood loss was minimal.      Non-bleeding internal hemorrhoids were found during retroflexion. The       hemorrhoids were Grade I (internal hemorrhoids that do not prolapse).       Estimated blood loss: none.      The exam was otherwise without abnormality on direct and retroflexion       views. Impression:            - The examined portion of the ileum was normal.                        - Diverticulosis in the recto-sigmoid colon.                        -  Two 1 to 2 mm polyps in the rectum and in the                         ascending colon.                        - Non-bleeding internal hemorrhoids.                        - The examination was otherwise normal on direct and                         retroflexion views.                        - No specimens collected. Recommendation:        - Patient has a contact number available for                         emergencies. The signs and symptoms of potential                         delayed complications were discussed with the patient.                         Return to normal activities tomorrow. Written                         discharge instructions were provided to the patient.                        - Discharge patient to home.                        - Resume  previous diet.                        - Continue present medications.                        - No aspirin, ibuprofen, naproxen, or other                         non-steroidal anti-inflammatory drugs for 5 days after                         polyp removal.                        - Await pathology results.                        - Repeat colonoscopy for surveillance based on                         pathology results.                        - Return to GI office as previously scheduled.                        - The findings and  recommendations were discussed with                         the patient. Procedure Code(s):     --- Professional ---                        [phone], Colonoscopy, flexible; diagnostic, including                         collection of specimen(s) by brushing or washing, when                         performed (separate procedure) Diagnosis Code(s):     --- Professional ---                        Z12.11, Encounter for screening for malignant neoplasm                         of colon                        K64.0, First degree hemorrhoids                        K62.1, Rectal polyp                        K63.5, Polyp of colon                        K57.30, Diverticulosis of large intestine without                         perforation or abscess without bleeding CPT copyright 2019 [employer]. All rights reserved. The codes documented in this report are preliminary and upon coder review may  be revised to meet current compliance requirements. Attending Participation:      I personally performed the entire procedure. Volney American, DO Annamaria Helling DO, DO 02/06/2022 8:13:34 AM This report has been signed electronically. Number of Addenda: 1 Note Initiated On: 02/06/2022 7:31 AM Scope Withdrawal Time: 0 hours 11 minutes 35 seconds  Total Procedure Duration: 0 hours 17 minutes 40 seconds  Estimated Blood Loss:  Estimated blood loss was minimal.       [facility] Addendum Number: 1   Addendum Date: 02/11/2022 1:38:57 PM      Two sessile polyps were found in the rectum and ascending colon. These       were completely resected and retrieved via cold jumbo forceps. The       polyps were 1 to 2 mm in size. Estimated blood loss was minimal. Volney American, DO Annamaria Helling DO, DO 02/11/2022 1:39:40 PM This report has been signed electronically.

## 2022-02-06 NOTE — Transfer of Care (Signed)
Immediate Anesthesia Transfer of Care Note  Patient: Alexis Wright  Procedure(s) Performed: COLONOSCOPY WITH PROPOFOL ESOPHAGOGASTRODUODENOSCOPY (EGD) WITH PROPOFOL  Patient Location: PACU  Anesthesia Type:General  Level of Consciousness: sedated  Airway & Oxygen Therapy: Patient Spontanous Breathing and Patient connected to nasal cannula oxygen  Post-op Assessment: Report given to RN and Post -op Vital signs reviewed and stable  Post vital signs: Reviewed and stable  Last Vitals:  Vitals Value Taken Time  BP 113/57 02/06/22 0811  Temp 35.8 C 02/06/22 0811  Pulse 68 02/06/22 0811  Resp 13 02/06/22 0811  SpO2 100 % 02/06/22 0811    Last Pain:  Vitals:   02/06/22 0811  TempSrc: Temporal         Complications: No notable events documented.

## 2022-02-06 NOTE — Anesthesia Preprocedure Evaluation (Signed)
Anesthesia Evaluation  Patient identified by MRN, date of birth, ID band Patient awake    Reviewed: Allergy & Precautions, NPO status , Patient's Chart, lab work & pertinent test results  Airway Mallampati: II  TM Distance: >3 FB Neck ROM: full    Dental  (+) Loose, Poor Dentition   Pulmonary neg pulmonary ROS,    Pulmonary exam normal        Cardiovascular negative cardio ROS Normal cardiovascular exam     Neuro/Psych negative neurological ROS  negative psych ROS   GI/Hepatic negative GI ROS, Neg liver ROS,   Endo/Other  negative endocrine ROS  Renal/GU negative Renal ROS  negative genitourinary   Musculoskeletal   Abdominal   Peds  Hematology negative hematology ROS (+)   Anesthesia Other Findings History reviewed. No pertinent past medical history.  Past Surgical History: No date: DENTAL SURGERY  BMI    Body Mass Index: 24.21 kg/m      Reproductive/Obstetrics negative OB ROS                             Anesthesia Physical Anesthesia Plan  ASA: 2  Anesthesia Plan: General   Post-op Pain Management: Minimal or no pain anticipated   Induction: Intravenous  PONV Risk Score and Plan: 3 and Propofol infusion, TIVA and Ondansetron  Airway Management Planned: Nasal Cannula  Additional Equipment: None  Intra-op Plan:   Post-operative Plan:   Informed Consent: I have reviewed the patients History and Physical, chart, labs and discussed the procedure including the risks, benefits and alternatives for the proposed anesthesia with the patient or authorized representative who has indicated his/her understanding and acceptance.     Dental advisory given  Plan Discussed with: CRNA and Surgeon  Anesthesia Plan Comments: (Discussed risks of anesthesia with patient, including possibility of difficulty with spontaneous ventilation under anesthesia necessitating airway  intervention, PONV, and rare risks such as cardiac or respiratory or neurological events, and allergic reactions. Discussed the role of CRNA in patient's perioperative care. Patient understands.)        Anesthesia Quick Evaluation

## 2022-02-07 ENCOUNTER — Encounter: Payer: Self-pay | Admitting: Gastroenterology

## 2022-02-07 LAB — SURGICAL PATHOLOGY

## 2022-03-11 ENCOUNTER — Other Ambulatory Visit (HOSPITAL_COMMUNITY): Payer: Self-pay | Admitting: Gastroenterology

## 2022-03-11 ENCOUNTER — Other Ambulatory Visit: Payer: Self-pay | Admitting: Gastroenterology

## 2022-03-11 DIAGNOSIS — R1011 Right upper quadrant pain: Secondary | ICD-10-CM | POA: Diagnosis not present

## 2022-03-11 DIAGNOSIS — B9681 Helicobacter pylori [H. pylori] as the cause of diseases classified elsewhere: Secondary | ICD-10-CM | POA: Diagnosis not present

## 2022-03-11 DIAGNOSIS — K2980 Duodenitis without bleeding: Secondary | ICD-10-CM | POA: Diagnosis not present

## 2022-03-11 DIAGNOSIS — D122 Benign neoplasm of ascending colon: Secondary | ICD-10-CM | POA: Diagnosis not present

## 2022-03-11 DIAGNOSIS — K219 Gastro-esophageal reflux disease without esophagitis: Secondary | ICD-10-CM | POA: Diagnosis not present

## 2022-03-11 DIAGNOSIS — Z800 Family history of malignant neoplasm of digestive organs: Secondary | ICD-10-CM | POA: Diagnosis not present

## 2022-03-11 DIAGNOSIS — K2970 Gastritis, unspecified, without bleeding: Secondary | ICD-10-CM | POA: Diagnosis not present

## 2022-03-17 ENCOUNTER — Ambulatory Visit
Admission: RE | Admit: 2022-03-17 | Discharge: 2022-03-17 | Disposition: A | Discharge status: Home / Self Care | Payer: 59 | Source: Ambulatory Visit | Attending: Gastroenterology | Admitting: Gastroenterology

## 2022-03-17 DIAGNOSIS — R1011 Right upper quadrant pain: Secondary | ICD-10-CM | POA: Insufficient documentation

## 2022-10-07 DIAGNOSIS — K219 Gastro-esophageal reflux disease without esophagitis: Secondary | ICD-10-CM | POA: Diagnosis not present

## 2022-10-07 DIAGNOSIS — Z800 Family history of malignant neoplasm of digestive organs: Secondary | ICD-10-CM | POA: Diagnosis not present

## 2022-10-07 DIAGNOSIS — R1013 Epigastric pain: Secondary | ICD-10-CM | POA: Diagnosis not present

## 2022-10-07 DIAGNOSIS — D122 Benign neoplasm of ascending colon: Secondary | ICD-10-CM | POA: Diagnosis not present

## 2022-10-07 DIAGNOSIS — B9681 Helicobacter pylori [H. pylori] as the cause of diseases classified elsewhere: Secondary | ICD-10-CM | POA: Diagnosis not present

## 2022-10-07 DIAGNOSIS — R1011 Right upper quadrant pain: Secondary | ICD-10-CM | POA: Diagnosis not present

## 2022-10-07 DIAGNOSIS — K2970 Gastritis, unspecified, without bleeding: Secondary | ICD-10-CM | POA: Diagnosis not present

## 2022-10-07 DIAGNOSIS — K2980 Duodenitis without bleeding: Secondary | ICD-10-CM | POA: Diagnosis not present

## 2022-10-21 DIAGNOSIS — Z0000 Encounter for general adult medical examination without abnormal findings: Secondary | ICD-10-CM | POA: Diagnosis not present

## 2022-10-30 DIAGNOSIS — Z0000 Encounter for general adult medical examination without abnormal findings: Secondary | ICD-10-CM | POA: Diagnosis not present

## 2022-10-31 DIAGNOSIS — B9681 Helicobacter pylori [H. pylori] as the cause of diseases classified elsewhere: Secondary | ICD-10-CM | POA: Diagnosis not present

## 2022-10-31 DIAGNOSIS — K2970 Gastritis, unspecified, without bleeding: Secondary | ICD-10-CM | POA: Diagnosis not present

## 2022-12-03 ENCOUNTER — Ambulatory Visit
Admission: RE | Admit: 2022-12-03 | Discharge: 2022-12-03 | Disposition: A | Discharge status: Home / Self Care | Payer: 59 | Source: Ambulatory Visit | Attending: Urgent Care | Admitting: Urgent Care

## 2022-12-03 VITALS — BP 143/86 | HR 74 | Temp 97.8°F | Resp 18

## 2022-12-03 DIAGNOSIS — B029 Zoster without complications: Secondary | ICD-10-CM

## 2022-12-03 MED ORDER — VALACYCLOVIR HCL 1 G PO TABS
1000.0000 mg | ORAL_TABLET | Freq: Three times a day (TID) | ORAL | 0 refills | Status: AC
Start: 2022-12-03 — End: ?

## 2022-12-03 MED ORDER — GABAPENTIN 100 MG PO CAPS
100.0000 mg | ORAL_CAPSULE | Freq: Three times a day (TID) | ORAL | 0 refills | Status: AC
Start: 2022-12-03 — End: 2022-12-24

## 2022-12-03 NOTE — ED Triage Notes (Signed)
Patient presents to UC for shingles rash on back since yesterday. Taking Tylenol. Denies fever.

## 2022-12-03 NOTE — ED Provider Notes (Addendum)
Renaldo Fiddler    CSN: [id number] Arrival date & time: 12/03/22  0806      History   Chief Complaint Chief Complaint  Patient presents with   Rash    I think I have shingles - Entered by patient    HPI Alexis Wright is a 47 y.o. female.    Rash   Patient presents to UC with concern for Rash on her back starting yesterday. She is concerned for possible shingles outbreak.  She endorses recent contact with environment on a hike but no other rash present on her body and back was covered with clothing.  She endorses L lower back pain that she describes as shooting/stabbing/burning in a band across L side lower back. No superficial burning pain.  She endorses hx of back pain, scoliosis.  Reports "bad" chicken pox as a child with scarring.  History reviewed. No pertinent past medical history.  There are no problems to display for this patient.   Past Surgical History:  Procedure Laterality Date   COLONOSCOPY WITH PROPOFOL N/A 02/06/2022   Procedure: COLONOSCOPY WITH PROPOFOL;  Surgeon: Jaynie Collins, DO;  Location: [facility] ENDOSCOPY;  Service: Gastroenterology;  Laterality: N/A;  USE PED COLONOSCOPE   DENTAL SURGERY     ESOPHAGOGASTRODUODENOSCOPY (EGD) WITH PROPOFOL N/A 02/06/2022   Procedure: ESOPHAGOGASTRODUODENOSCOPY (EGD) WITH PROPOFOL;  Surgeon: Jaynie Collins, DO;  Location: [facility] ENDOSCOPY;  Service: Gastroenterology;  Laterality: N/A;    OB History   No obstetric history on file.      Home Medications    Prior to Admission medications   Medication Sig Start Date End Date Taking? Authorizing Provider  hydrOXYzine (ATARAX/VISTARIL) 25 MG tablet Take 1 tablet (25 mg total) by mouth 3 (three) times daily as needed for anxiety. 04/03/21   Minna Antis, MD    Family History Family History  Problem Relation Age of Onset   Breast cancer Neg Hx     Social History Social History   Tobacco Use   Smoking status: Never   Smokeless  tobacco: Never  Vaping Use   Vaping Use: Never used  Substance Use Topics   Alcohol use: No   Drug use: No     Allergies   Patient has no known allergies.   Review of Systems Review of Systems  Skin:  Positive for rash.     Physical Exam Triage Vital Signs ED Triage Vitals  Enc Vitals Group     BP 12/03/22 0833 (!) 143/86     Pulse Rate 12/03/22 0833 74     Resp 12/03/22 0833 18     Temp 12/03/22 0833 97.8 F (36.6 C)     Temp Source 12/03/22 0833 Temporal     SpO2 12/03/22 0833 98 %     Weight --      Height --      Head Circumference --      Peak Flow --      Pain Score 12/03/22 0832 0     Pain Loc --      Pain Edu? --      Excl. in GC? --    No data found.  Updated Vital Signs BP (!) 143/86 (BP Location: Left Arm)   Pulse 74   Temp 97.8 F (36.6 C) (Temporal)   Resp 18   LMP 11/26/2022 (Approximate)   SpO2 98%   Visual Acuity Right Eye Distance:   Left Eye Distance:   Bilateral Distance:    Right Eye Near:  Left Eye Near:    Bilateral Near:     Physical Exam Vitals reviewed.  Constitutional:      Appearance: Normal appearance.  Musculoskeletal:     Lumbar back: No tenderness or bony tenderness. Normal range of motion. Negative right straight leg raise test and negative left straight leg raise test. Scoliosis present.  Skin:    General: Skin is warm and dry.     Findings: Rash present. Rash is vesicular.       Neurological:     General: No focal deficit present.     Mental Status: She is alert and oriented to person, place, and time.  Psychiatric:        Mood and Affect: Mood normal.        Behavior: Behavior normal.      UC Treatments / Results  Labs (all labs ordered are listed, but only abnormal results are displayed) Labs Reviewed - No data to display  EKG   Radiology No results found.  Procedures Procedures (including critical care time)  Medications Ordered in UC Medications - No data to display  Initial  Impression / Assessment and Plan / UC Course  I have reviewed the triage vital signs and the nursing notes.  Pertinent labs & imaging results that were available during my care of the patient were reviewed by me and considered in my medical decision making (see chart for details).   Vesicular rash is present on the patient's lower back, L side. Most lesions appear in a state of partial resolution however the area is painted with calamine lotion. Not tender to palpation. Negative SLR bilaterally. No lower back tenderness with palpation, neither muscular nor bony. No decreased ROM.  Possible zoster rash in the L3 or S1 dermatome on the L side. Will treat with valacyclovir per protocol. Also prescribing short course of gabapentin for relief of neuropathic pain.  Counseled patient on potential for adverse effects with medications prescribed/recommended today, ER and return-to-clinic precautions discussed, patient verbalized understanding and agreement with care plan.  Final Clinical Impressions(s) / UC Diagnoses   Final diagnoses:  None   Discharge Instructions   None    ED Prescriptions   None    PDMP not reviewed this encounter.   Charma Igo, FNP 12/03/22 0917    Charma Igo, FNP 12/03/22 [phone]

## 2022-12-03 NOTE — Discharge Instructions (Addendum)
Schedule a follow up visit with your PCP. Consider shingles vaccinations.
